# Patient Record
Sex: Female | Born: 1935
Health system: Southern US, Community
[De-identification: ages and names within clinical notes are randomized; demographics above are authoritative.]

## PROBLEM LIST (undated history)

## (undated) DIAGNOSIS — N3941 Urge incontinence: Secondary | ICD-10-CM

## (undated) DIAGNOSIS — R739 Hyperglycemia, unspecified: Secondary | ICD-10-CM

## (undated) DIAGNOSIS — M6530 Trigger finger, unspecified finger: Secondary | ICD-10-CM

## (undated) DIAGNOSIS — L570 Actinic keratosis: Secondary | ICD-10-CM

## (undated) DIAGNOSIS — R457 State of emotional shock and stress, unspecified: Secondary | ICD-10-CM

## (undated) DIAGNOSIS — I10 Essential (primary) hypertension: Secondary | ICD-10-CM

## (undated) DIAGNOSIS — I2510 Atherosclerotic heart disease of native coronary artery without angina pectoris: Secondary | ICD-10-CM

## (undated) DIAGNOSIS — E039 Hypothyroidism, unspecified: Secondary | ICD-10-CM

## (undated) HISTORY — PX: COLONOSCOPY: SHX174

## (undated) HISTORY — PX: OTHER SURGICAL HISTORY: SHX169

## (undated) HISTORY — PX: CATARACT EXTRACTION: SUR2

## (undated) HISTORY — PX: TUBAL LIGATION: SHX77

## (undated) HISTORY — PX: ABDOMINAL HYSTERECTOMY: SHX81

---

## 2003-12-27 ENCOUNTER — Ambulatory Visit: Payer: Self-pay | Admitting: Ophthalmology

## 2004-02-02 ENCOUNTER — Ambulatory Visit: Payer: Self-pay | Admitting: Ophthalmology

## 2004-02-16 ENCOUNTER — Ambulatory Visit: Payer: Self-pay | Admitting: Internal Medicine

## 2005-02-27 ENCOUNTER — Ambulatory Visit: Payer: Self-pay | Admitting: Internal Medicine

## 2005-04-10 ENCOUNTER — Ambulatory Visit: Payer: Self-pay | Admitting: Podiatry

## 2006-03-07 ENCOUNTER — Ambulatory Visit: Payer: Self-pay | Admitting: Internal Medicine

## 2007-05-09 ENCOUNTER — Ambulatory Visit: Payer: Self-pay | Admitting: Gastroenterology

## 2007-05-15 ENCOUNTER — Ambulatory Visit: Payer: Self-pay | Admitting: Internal Medicine

## 2007-06-03 ENCOUNTER — Ambulatory Visit: Payer: Self-pay | Admitting: Gastroenterology

## 2008-05-18 ENCOUNTER — Ambulatory Visit: Payer: Self-pay | Admitting: Internal Medicine

## 2009-05-24 ENCOUNTER — Ambulatory Visit: Payer: Self-pay | Admitting: Internal Medicine

## 2010-06-01 ENCOUNTER — Ambulatory Visit: Payer: Self-pay | Admitting: Internal Medicine

## 2011-08-21 ENCOUNTER — Ambulatory Visit: Payer: Self-pay | Admitting: Internal Medicine

## 2012-07-08 ENCOUNTER — Ambulatory Visit: Payer: Self-pay | Admitting: Gastroenterology

## 2012-08-21 ENCOUNTER — Ambulatory Visit: Payer: Self-pay | Admitting: Internal Medicine

## 2012-08-26 ENCOUNTER — Ambulatory Visit: Payer: Self-pay | Admitting: Internal Medicine

## 2013-08-24 ENCOUNTER — Ambulatory Visit: Payer: Self-pay | Admitting: Internal Medicine

## 2014-12-08 ENCOUNTER — Other Ambulatory Visit: Payer: Self-pay | Admitting: Internal Medicine

## 2014-12-08 DIAGNOSIS — Z1231 Encounter for screening mammogram for malignant neoplasm of breast: Secondary | ICD-10-CM

## 2014-12-16 ENCOUNTER — Ambulatory Visit
Admission: RE | Admit: 2014-12-16 | Discharge: 2014-12-16 | Disposition: A | Discharge status: Home / Self Care | Payer: PPO | Source: Ambulatory Visit | Attending: Internal Medicine | Admitting: Internal Medicine

## 2014-12-16 DIAGNOSIS — Z1231 Encounter for screening mammogram for malignant neoplasm of breast: Secondary | ICD-10-CM

## 2015-08-11 DIAGNOSIS — Z961 Presence of intraocular lens: Secondary | ICD-10-CM | POA: Diagnosis not present

## 2015-08-24 DIAGNOSIS — N898 Other specified noninflammatory disorders of vagina: Secondary | ICD-10-CM | POA: Diagnosis not present

## 2015-12-02 DIAGNOSIS — Z23 Encounter for immunization: Secondary | ICD-10-CM | POA: Diagnosis not present

## 2015-12-02 DIAGNOSIS — Z0000 Encounter for general adult medical examination without abnormal findings: Secondary | ICD-10-CM | POA: Diagnosis not present

## 2015-12-02 DIAGNOSIS — Z139 Encounter for screening, unspecified: Secondary | ICD-10-CM | POA: Diagnosis not present

## 2015-12-02 DIAGNOSIS — E039 Hypothyroidism, unspecified: Secondary | ICD-10-CM | POA: Diagnosis not present

## 2015-12-05 DIAGNOSIS — Z08 Encounter for follow-up examination after completed treatment for malignant neoplasm: Secondary | ICD-10-CM | POA: Diagnosis not present

## 2015-12-05 DIAGNOSIS — Z1283 Encounter for screening for malignant neoplasm of skin: Secondary | ICD-10-CM | POA: Diagnosis not present

## 2015-12-05 DIAGNOSIS — Z85828 Personal history of other malignant neoplasm of skin: Secondary | ICD-10-CM | POA: Diagnosis not present

## 2015-12-05 DIAGNOSIS — L570 Actinic keratosis: Secondary | ICD-10-CM | POA: Diagnosis not present

## 2015-12-12 DIAGNOSIS — R739 Hyperglycemia, unspecified: Secondary | ICD-10-CM | POA: Diagnosis not present

## 2015-12-12 DIAGNOSIS — L570 Actinic keratosis: Secondary | ICD-10-CM | POA: Diagnosis not present

## 2015-12-12 DIAGNOSIS — Z23 Encounter for immunization: Secondary | ICD-10-CM | POA: Diagnosis not present

## 2015-12-12 DIAGNOSIS — E039 Hypothyroidism, unspecified: Secondary | ICD-10-CM | POA: Diagnosis not present

## 2015-12-12 DIAGNOSIS — N3941 Urge incontinence: Secondary | ICD-10-CM | POA: Diagnosis not present

## 2015-12-12 DIAGNOSIS — Z1239 Encounter for other screening for malignant neoplasm of breast: Secondary | ICD-10-CM | POA: Diagnosis not present

## 2015-12-12 DIAGNOSIS — Z0000 Encounter for general adult medical examination without abnormal findings: Secondary | ICD-10-CM | POA: Diagnosis not present

## 2015-12-13 ENCOUNTER — Other Ambulatory Visit: Payer: Self-pay | Admitting: Internal Medicine

## 2015-12-13 DIAGNOSIS — Z1231 Encounter for screening mammogram for malignant neoplasm of breast: Secondary | ICD-10-CM

## 2015-12-28 ENCOUNTER — Ambulatory Visit
Admission: RE | Admit: 2015-12-28 | Discharge: 2015-12-28 | Disposition: A | Discharge status: Home / Self Care | Payer: PPO | Source: Ambulatory Visit | Attending: Internal Medicine | Admitting: Internal Medicine

## 2015-12-28 DIAGNOSIS — Z1231 Encounter for screening mammogram for malignant neoplasm of breast: Secondary | ICD-10-CM

## 2016-01-31 DIAGNOSIS — N898 Other specified noninflammatory disorders of vagina: Secondary | ICD-10-CM | POA: Diagnosis not present

## 2016-02-21 DIAGNOSIS — N898 Other specified noninflammatory disorders of vagina: Secondary | ICD-10-CM | POA: Diagnosis not present

## 2016-02-21 DIAGNOSIS — N763 Subacute and chronic vulvitis: Secondary | ICD-10-CM | POA: Diagnosis not present

## 2016-02-21 DIAGNOSIS — N9089 Other specified noninflammatory disorders of vulva and perineum: Secondary | ICD-10-CM | POA: Diagnosis not present

## 2016-04-27 DIAGNOSIS — Z0000 Encounter for general adult medical examination without abnormal findings: Secondary | ICD-10-CM | POA: Diagnosis not present

## 2016-06-11 DIAGNOSIS — E039 Hypothyroidism, unspecified: Secondary | ICD-10-CM | POA: Diagnosis not present

## 2016-06-11 DIAGNOSIS — R739 Hyperglycemia, unspecified: Secondary | ICD-10-CM | POA: Diagnosis not present

## 2016-06-11 DIAGNOSIS — Z0000 Encounter for general adult medical examination without abnormal findings: Secondary | ICD-10-CM | POA: Diagnosis not present

## 2016-06-25 DIAGNOSIS — N9089 Other specified noninflammatory disorders of vulva and perineum: Secondary | ICD-10-CM | POA: Diagnosis not present

## 2016-08-22 DIAGNOSIS — Z961 Presence of intraocular lens: Secondary | ICD-10-CM | POA: Diagnosis not present

## 2016-09-17 DIAGNOSIS — R946 Abnormal results of thyroid function studies: Secondary | ICD-10-CM | POA: Diagnosis not present

## 2016-11-20 ENCOUNTER — Other Ambulatory Visit: Payer: Self-pay | Admitting: Internal Medicine

## 2016-11-20 DIAGNOSIS — Z1231 Encounter for screening mammogram for malignant neoplasm of breast: Secondary | ICD-10-CM

## 2016-12-04 DIAGNOSIS — L578 Other skin changes due to chronic exposure to nonionizing radiation: Secondary | ICD-10-CM | POA: Diagnosis not present

## 2016-12-04 DIAGNOSIS — Z859 Personal history of malignant neoplasm, unspecified: Secondary | ICD-10-CM | POA: Diagnosis not present

## 2016-12-04 DIAGNOSIS — Z872 Personal history of diseases of the skin and subcutaneous tissue: Secondary | ICD-10-CM | POA: Diagnosis not present

## 2016-12-04 DIAGNOSIS — L900 Lichen sclerosus et atrophicus: Secondary | ICD-10-CM | POA: Diagnosis not present

## 2016-12-04 DIAGNOSIS — L570 Actinic keratosis: Secondary | ICD-10-CM | POA: Diagnosis not present

## 2016-12-04 DIAGNOSIS — B373 Candidiasis of vulva and vagina: Secondary | ICD-10-CM | POA: Diagnosis not present

## 2016-12-05 DIAGNOSIS — Z0000 Encounter for general adult medical examination without abnormal findings: Secondary | ICD-10-CM | POA: Diagnosis not present

## 2016-12-05 DIAGNOSIS — R739 Hyperglycemia, unspecified: Secondary | ICD-10-CM | POA: Diagnosis not present

## 2016-12-05 DIAGNOSIS — E039 Hypothyroidism, unspecified: Secondary | ICD-10-CM | POA: Diagnosis not present

## 2016-12-12 DIAGNOSIS — Z23 Encounter for immunization: Secondary | ICD-10-CM | POA: Diagnosis not present

## 2016-12-12 DIAGNOSIS — E039 Hypothyroidism, unspecified: Secondary | ICD-10-CM | POA: Diagnosis not present

## 2016-12-12 DIAGNOSIS — R110 Nausea: Secondary | ICD-10-CM | POA: Diagnosis not present

## 2016-12-12 DIAGNOSIS — R739 Hyperglycemia, unspecified: Secondary | ICD-10-CM | POA: Diagnosis not present

## 2016-12-12 DIAGNOSIS — Z0000 Encounter for general adult medical examination without abnormal findings: Secondary | ICD-10-CM | POA: Diagnosis not present

## 2016-12-18 DIAGNOSIS — Z86010 Personal history of colonic polyps: Secondary | ICD-10-CM | POA: Diagnosis not present

## 2017-01-09 ENCOUNTER — Ambulatory Visit
Admission: RE | Admit: 2017-01-09 | Discharge: 2017-01-09 | Disposition: A | Discharge status: Home / Self Care | Payer: PPO | Source: Ambulatory Visit | Attending: Internal Medicine | Admitting: Internal Medicine

## 2017-01-09 DIAGNOSIS — Z1231 Encounter for screening mammogram for malignant neoplasm of breast: Secondary | ICD-10-CM

## 2017-01-15 DIAGNOSIS — L900 Lichen sclerosus et atrophicus: Secondary | ICD-10-CM | POA: Diagnosis not present

## 2017-02-14 ENCOUNTER — Encounter: Payer: Self-pay | Admitting: *Deleted

## 2017-02-15 ENCOUNTER — Ambulatory Visit
Admission: RE | Admit: 2017-02-15 | Discharge: 2017-02-15 | Disposition: A | Discharge status: Home / Self Care | Payer: PPO | Source: Ambulatory Visit | Attending: Gastroenterology | Admitting: Gastroenterology

## 2017-02-15 ENCOUNTER — Encounter: Payer: Self-pay | Admitting: Anesthesiology

## 2017-02-15 ENCOUNTER — Encounter
Admission: RE | Disposition: A | Discharge status: Home / Self Care | Payer: Self-pay | Source: Ambulatory Visit | Attending: Gastroenterology

## 2017-02-15 ENCOUNTER — Ambulatory Visit: Payer: PPO | Admitting: Anesthesiology

## 2017-02-15 DIAGNOSIS — K5730 Diverticulosis of large intestine without perforation or abscess without bleeding: Secondary | ICD-10-CM | POA: Diagnosis not present

## 2017-02-15 DIAGNOSIS — K649 Unspecified hemorrhoids: Secondary | ICD-10-CM | POA: Diagnosis not present

## 2017-02-15 DIAGNOSIS — K5900 Constipation, unspecified: Secondary | ICD-10-CM | POA: Diagnosis not present

## 2017-02-15 DIAGNOSIS — K5790 Diverticulosis of intestine, part unspecified, without perforation or abscess without bleeding: Secondary | ICD-10-CM | POA: Diagnosis not present

## 2017-02-15 DIAGNOSIS — K648 Other hemorrhoids: Secondary | ICD-10-CM | POA: Diagnosis not present

## 2017-02-15 DIAGNOSIS — Z86010 Personal history of colonic polyps: Secondary | ICD-10-CM | POA: Diagnosis not present

## 2017-02-15 DIAGNOSIS — K621 Rectal polyp: Secondary | ICD-10-CM | POA: Diagnosis not present

## 2017-02-15 DIAGNOSIS — Z79899 Other long term (current) drug therapy: Secondary | ICD-10-CM | POA: Insufficient documentation

## 2017-02-15 DIAGNOSIS — E039 Hypothyroidism, unspecified: Secondary | ICD-10-CM | POA: Diagnosis not present

## 2017-02-15 HISTORY — DX: Hypothyroidism, unspecified: E03.9

## 2017-02-15 HISTORY — DX: Actinic keratosis: L57.0

## 2017-02-15 HISTORY — DX: Trigger finger, unspecified finger: M65.30

## 2017-02-15 HISTORY — PX: COLONOSCOPY WITH PROPOFOL: SHX5780

## 2017-02-15 HISTORY — DX: Urge incontinence: N39.41

## 2017-02-15 HISTORY — DX: Hyperglycemia, unspecified: R73.9

## 2017-02-15 HISTORY — DX: State of emotional shock and stress, unspecified: R45.7

## 2017-02-15 SURGERY — COLONOSCOPY WITH PROPOFOL
Anesthesia: General

## 2017-02-15 MED ORDER — FENTANYL CITRATE (PF) 100 MCG/2ML IJ SOLN
INTRAMUSCULAR | Status: DC | PRN
  Administered 2017-02-15: 50 ug via INTRAVENOUS

## 2017-02-15 MED ORDER — PROPOFOL 500 MG/50ML IV EMUL
INTRAVENOUS | Status: AC
  Filled 2017-02-15: qty 50

## 2017-02-15 MED ORDER — PROPOFOL 500 MG/50ML IV EMUL
INTRAVENOUS | Status: DC | PRN
  Administered 2017-02-15: 100 ug/kg/min via INTRAVENOUS

## 2017-02-15 MED ORDER — SODIUM CHLORIDE 0.9 % IV SOLN: INTRAVENOUS | Status: DC

## 2017-02-15 MED ORDER — FENTANYL CITRATE (PF) 100 MCG/2ML IJ SOLN
INTRAMUSCULAR | Status: AC
  Filled 2017-02-15: qty 2

## 2017-02-15 MED ORDER — SODIUM CHLORIDE 0.9 % IV SOLN
INTRAVENOUS | Status: DC
  Administered 2017-02-15: 1000 mL via INTRAVENOUS

## 2017-02-15 MED ORDER — EPHEDRINE SULFATE 50 MG/ML IJ SOLN
INTRAMUSCULAR | Status: DC | PRN
  Administered 2017-02-15: 10 mg via INTRAVENOUS

## 2017-02-15 NOTE — Anesthesia Post-op Follow-up Note (Signed)
Anesthesia QCDR form completed.        

## 2017-02-15 NOTE — Anesthesia Preprocedure Evaluation (Signed)
Anesthesia Evaluation  Patient identified by MRN, date of birth, ID band Patient awake    Reviewed: Allergy & Precautions, NPO status , Patient's Chart, lab work & pertinent test results  History of Anesthesia Complications Negative for: history of anesthetic complications  Airway Mallampati: III  TM Distance: >3 FB Neck ROM: Full    Dental  (+) Implants   Pulmonary neg pulmonary ROS, neg sleep apnea, neg COPD,    breath sounds clear to auscultation- rhonchi (-) wheezing      Cardiovascular Exercise Tolerance: Good (-) hypertension(-) CAD, (-) Past MI and (-) Cardiac Stents  Rhythm:Regular Rate:Normal - Systolic murmurs and - Diastolic murmurs    Neuro/Psych negative neurological ROS  negative psych ROS   GI/Hepatic negative GI ROS, Neg liver ROS,   Endo/Other  neg diabetesHypothyroidism   Renal/GU negative Renal ROS     Musculoskeletal negative musculoskeletal ROS (+)   Abdominal (+) - obese,   Peds  Hematology negative hematology ROS (+)   Anesthesia Other Findings Past Medical History: No date: Elevated blood sugar No date: Emotional stress No date: Hypothyroidism No date: Keratosis No date: Stenosing tenosynovitis of finger No date: Urge incontinence of urine   Reproductive/Obstetrics                             Anesthesia Physical Anesthesia Plan  ASA: II  Anesthesia Plan: General   Post-op Pain Management:    Induction: Intravenous  PONV Risk Score and Plan: 2 and Propofol infusion  Airway Management Planned: Natural Airway  Additional Equipment:   Intra-op Plan:   Post-operative Plan:   Informed Consent: I have reviewed the patients History and Physical, chart, labs and discussed the procedure including the risks, benefits and alternatives for the proposed anesthesia with the patient or authorized representative who has indicated his/her understanding and  acceptance.   Dental advisory given  Plan Discussed with: CRNA and Anesthesiologist  Anesthesia Plan Comments:         Anesthesia Quick Evaluation

## 2017-02-15 NOTE — Anesthesia Postprocedure Evaluation (Signed)
Anesthesia Post Note  Patient: Tracey Robles  Procedure(s) Performed: COLONOSCOPY WITH PROPOFOL (N/A )  Patient location during evaluation: Endoscopy Anesthesia Type: General Level of consciousness: awake and alert and oriented Pain management: pain level controlled Vital Signs Assessment: post-procedure vital signs reviewed and stable Respiratory status: spontaneous breathing, nonlabored ventilation and respiratory function stable Cardiovascular status: blood pressure returned to baseline and stable Postop Assessment: no signs of nausea or vomiting Anesthetic complications: no     Last Vitals:  Vitals:   02/15/17 1146 02/15/17 1310  BP: (!) 144/71   Pulse: 81   Resp: 17   Temp: (!) 36.3 C (!) 35.7 C  SpO2: 99%     Last Pain:  Vitals:   02/15/17 1310  TempSrc: Tympanic                 Airyana Sprunger

## 2017-02-15 NOTE — Op Note (Signed)
[facility]astroenter[facility] Patient Name: Zanetta Dehaan Procedure Date: 02/15/2017 12:31 PM MRN: [id number] Account #: [id number] Date of Birth: [birth year] Admit Type: Outpatient Age: 83 Room: [facility] ENDO ROOM 1 Gender: Female Note Status: Finalized Procedure:            Colonoscopy Indications:          Change in bowel habits, Constipation Providers:            Lollie Sails, MD Referring MD:         Tracie Harrier, MD (Referring MD) Medicines:            Monitored Anesthesia Care Complications:        No immediate complications. Procedure:            Pre-Anesthesia Assessment:                       - ASA Grade Assessment: II - A patient with mild                        systemic disease.                       After obtaining informed consent, the colonoscope was                        passed under direct vision. Throughout the procedure,                        the patient's blood pressure, pulse, and oxygen                        saturations were monitored continuously. The Olympus                        PCF-H180AL colonoscope ( S#: [id number] ) was introduced                        through the anus and advanced to the the cecum,                        identified by appendiceal orifice and ileocecal valve.                        The colonoscopy was performed with moderate difficulty                        due to multiple diverticula in the colon and a tortuous                        colon. Successful completion of the procedure was aided                        by changing the patient to a supine position, changing                        the patient to a prone position and using manual                        pressure. The patient tolerated the procedure well. The  quality of the bowel preparation was fair. Findings:      Many small and large-mouthed diverticula were found in the sigmoid       colon, descending colon and transverse  colon.      Four sessile polyps were found in the rectum. The polyps were less than       1 mm in size. These polyps were removed with a cold biopsy forceps.       Resection and retrieval were complete.      Non-bleeding internal hemorrhoids were found during anoscopy. The       hemorrhoids were small.      The digital rectal exam was normal. Impression:           - Preparation of the colon was fair.                       - Diverticulosis in the sigmoid colon, in the                        descending colon and in the transverse colon.                       - Four less than 1 mm polyps in the rectum, removed                        with a cold biopsy forceps. Resected and retrieved.                       - Non-bleeding internal hemorrhoids. Recommendation:       - Discharge patient to home.                       - Advance diet as tolerated. Procedure Code(s):    --- Professional ---                       [phone], Colonoscopy, flexible; with biopsy, single or                        multiple Diagnosis Code(s):    --- Professional ---                       K64.8, Other hemorrhoids                       K62.1, Rectal polyp                       R19.4, Change in bowel habit                       K59.00, Constipation, unspecified                       K57.30, Diverticulosis of large intestine without                        perforation or abscess without bleeding CPT copyright 2016 American Medical Association. All rights reserved. The codes documented in this report are preliminary and upon coder review may  be revised to meet current compliance requirements. Lollie Sails, MD 02/15/2017 1:08:29 PM This report has been signed electronically. Number of Addenda: 0 Note Initiated  On: 02/15/2017 12:31 PM Scope Withdrawal Time: 0 hours 11 minutes 11 seconds  Total Procedure Duration: 0 hours 29 minutes 37 seconds       [facility]

## 2017-02-15 NOTE — H&P (Signed)
Outpatient short stay form Pre-procedure 02/15/2017 12:09 PM Lollie Sails MD  Primary Physician: Dr Tracie Harrier  Reason for visit:  Colonoscopy  History of present illness:  Patient is a 83 year old female presenting today as above. She has a personal history of adenomatous colon polyps as well as some increasing constipation of past couple months. She tolerated her prep well. She takes no aspirin or blood thinning agent.    Current Facility-Administered Medications:  .  0.9 %  sodium chloride infusion, , Intravenous, Continuous, Lollie Sails, MD, Last Rate: 20 mL/hr at 02/15/17 1201, 1,000 mL at 02/15/17 1201 .  0.9 %  sodium chloride infusion, , Intravenous, Continuous, Lollie Sails, MD  Medications Prior to Admission  Medication Sig Dispense Refill Last Dose  . clobetasol cream (TEMOVATE) 3.84 % Apply 1 application topically 2 (two) times daily.     . fluconazole (DIFLUCAN) 200 MG tablet Take 200 mg by mouth daily.     Marland Kitchen levothyroxine (SYNTHROID, LEVOTHROID) 75 MCG tablet Take 75 mcg by mouth daily before breakfast.   02/15/2017 at 0400     Allergies  Allergen Reactions  . Iodine      Past Medical History:  Diagnosis Date  . Elevated blood sugar   . Emotional stress   . Hypothyroidism   . Keratosis   . Stenosing tenosynovitis of finger   . Urge incontinence of urine     Review of systems:      Physical Exam    Heart and lungs: Regular rate and rhythm without rub or gallop, lungs are bilaterally clear.    HEENT: Normocephalic atraumatic eyes are anicteric    Other:     Pertinant exam for procedure: Soft nontender nondistended bowel sounds positive normoactive    Planned proceedures: Colonoscopy and indicated procedures. I have discussed the risks benefits and complications of procedures to include not limited to bleeding, infection, perforation and the risk of sedation and the patient wishes to proceed.    Lollie Sails,  MD Gastroenterology 02/15/2017  12:09 PM

## 2017-02-15 NOTE — Anesthesia Procedure Notes (Signed)
Performed by: Cook-Martin, Azaliyah Kennard Pre-anesthesia Checklist: Patient identified, Emergency Drugs available, Suction available, Patient being monitored and Timeout performed Patient Re-evaluated:Patient Re-evaluated prior to induction Oxygen Delivery Method: Nasal cannula Preoxygenation: Pre-oxygenation with 100% oxygen Induction Type: IV induction Placement Confirmation: CO2 detector and positive ETCO2       

## 2017-02-15 NOTE — Transfer of Care (Signed)
Immediate Anesthesia Transfer of Care Note  Patient: Tracey Robles  Procedure(s) Performed: COLONOSCOPY WITH PROPOFOL (N/A )  Patient Location: PACU  Anesthesia Type:General  Level of Consciousness: awake and sedated  Airway & Oxygen Therapy: Patient Spontanous Breathing and Patient connected to nasal cannula oxygen  Post-op Assessment: Report given to RN and Post -op Vital signs reviewed and stable  Post vital signs: Reviewed and stable  Last Vitals:  Vitals:   02/15/17 1146  BP: (!) 144/71  Pulse: 81  Resp: 17  Temp: (!) 36.3 C  SpO2: 99%    Last Pain:  Vitals:   02/15/17 1146  TempSrc: Tympanic         Complications: No apparent anesthesia complications

## 2017-02-18 ENCOUNTER — Encounter: Payer: Self-pay | Admitting: Gastroenterology

## 2017-02-27 LAB — SURGICAL PATHOLOGY

## 2017-06-05 DIAGNOSIS — R110 Nausea: Secondary | ICD-10-CM | POA: Diagnosis not present

## 2017-06-05 DIAGNOSIS — Z0000 Encounter for general adult medical examination without abnormal findings: Secondary | ICD-10-CM | POA: Diagnosis not present

## 2017-06-05 DIAGNOSIS — R739 Hyperglycemia, unspecified: Secondary | ICD-10-CM | POA: Diagnosis not present

## 2017-06-05 DIAGNOSIS — E039 Hypothyroidism, unspecified: Secondary | ICD-10-CM | POA: Diagnosis not present

## 2017-06-12 DIAGNOSIS — E039 Hypothyroidism, unspecified: Secondary | ICD-10-CM | POA: Diagnosis not present

## 2017-06-12 DIAGNOSIS — R739 Hyperglycemia, unspecified: Secondary | ICD-10-CM | POA: Diagnosis not present

## 2017-06-12 DIAGNOSIS — R198 Other specified symptoms and signs involving the digestive system and abdomen: Secondary | ICD-10-CM | POA: Diagnosis not present

## 2017-06-12 DIAGNOSIS — R112 Nausea with vomiting, unspecified: Secondary | ICD-10-CM | POA: Diagnosis not present

## 2017-06-12 DIAGNOSIS — K582 Mixed irritable bowel syndrome: Secondary | ICD-10-CM | POA: Diagnosis not present

## 2017-07-17 DIAGNOSIS — D7589 Other specified diseases of blood and blood-forming organs: Secondary | ICD-10-CM | POA: Diagnosis not present

## 2017-07-17 DIAGNOSIS — L900 Lichen sclerosus et atrophicus: Secondary | ICD-10-CM | POA: Diagnosis not present

## 2017-12-06 ENCOUNTER — Other Ambulatory Visit: Payer: Self-pay | Admitting: Internal Medicine

## 2017-12-06 DIAGNOSIS — Z1231 Encounter for screening mammogram for malignant neoplasm of breast: Secondary | ICD-10-CM

## 2017-12-11 DIAGNOSIS — L578 Other skin changes due to chronic exposure to nonionizing radiation: Secondary | ICD-10-CM | POA: Diagnosis not present

## 2017-12-11 DIAGNOSIS — L570 Actinic keratosis: Secondary | ICD-10-CM | POA: Diagnosis not present

## 2017-12-11 DIAGNOSIS — Z859 Personal history of malignant neoplasm, unspecified: Secondary | ICD-10-CM | POA: Diagnosis not present

## 2017-12-11 DIAGNOSIS — B373 Candidiasis of vulva and vagina: Secondary | ICD-10-CM | POA: Diagnosis not present

## 2017-12-11 DIAGNOSIS — Z1283 Encounter for screening for malignant neoplasm of skin: Secondary | ICD-10-CM | POA: Diagnosis not present

## 2017-12-11 DIAGNOSIS — Z872 Personal history of diseases of the skin and subcutaneous tissue: Secondary | ICD-10-CM | POA: Diagnosis not present

## 2017-12-11 DIAGNOSIS — L900 Lichen sclerosus et atrophicus: Secondary | ICD-10-CM | POA: Diagnosis not present

## 2017-12-30 DIAGNOSIS — R8290 Unspecified abnormal findings in urine: Secondary | ICD-10-CM | POA: Diagnosis not present

## 2017-12-30 DIAGNOSIS — R739 Hyperglycemia, unspecified: Secondary | ICD-10-CM | POA: Diagnosis not present

## 2017-12-30 DIAGNOSIS — R198 Other specified symptoms and signs involving the digestive system and abdomen: Secondary | ICD-10-CM | POA: Diagnosis not present

## 2017-12-30 DIAGNOSIS — E039 Hypothyroidism, unspecified: Secondary | ICD-10-CM | POA: Diagnosis not present

## 2017-12-30 DIAGNOSIS — R112 Nausea with vomiting, unspecified: Secondary | ICD-10-CM | POA: Diagnosis not present

## 2018-01-06 DIAGNOSIS — R739 Hyperglycemia, unspecified: Secondary | ICD-10-CM | POA: Diagnosis not present

## 2018-01-06 DIAGNOSIS — E786 Lipoprotein deficiency: Secondary | ICD-10-CM | POA: Diagnosis not present

## 2018-01-06 DIAGNOSIS — E039 Hypothyroidism, unspecified: Secondary | ICD-10-CM | POA: Diagnosis not present

## 2018-01-06 DIAGNOSIS — Z0000 Encounter for general adult medical examination without abnormal findings: Secondary | ICD-10-CM | POA: Diagnosis not present

## 2018-01-06 DIAGNOSIS — L570 Actinic keratosis: Secondary | ICD-10-CM | POA: Diagnosis not present

## 2018-01-06 DIAGNOSIS — Z23 Encounter for immunization: Secondary | ICD-10-CM | POA: Diagnosis not present

## 2018-01-10 ENCOUNTER — Ambulatory Visit
Admission: RE | Admit: 2018-01-10 | Discharge: 2018-01-10 | Disposition: A | Discharge status: Home / Self Care | Payer: PPO | Source: Ambulatory Visit | Attending: Internal Medicine | Admitting: Internal Medicine

## 2018-01-10 DIAGNOSIS — Z1231 Encounter for screening mammogram for malignant neoplasm of breast: Secondary | ICD-10-CM | POA: Diagnosis not present

## 2018-04-07 ENCOUNTER — Emergency Department: Payer: PPO

## 2018-04-07 ENCOUNTER — Encounter: Payer: Self-pay | Admitting: Emergency Medicine

## 2018-04-07 ENCOUNTER — Inpatient Hospital Stay
Admission: EM | Admit: 2018-04-07 | Discharge: 2018-04-09 | DRG: 247 | Disposition: A | Discharge status: Home / Self Care | Payer: PPO | Attending: Family Medicine | Admitting: Family Medicine

## 2018-04-07 ENCOUNTER — Other Ambulatory Visit: Payer: Self-pay

## 2018-04-07 ENCOUNTER — Encounter
Admission: EM | Disposition: A | Discharge status: Home / Self Care | Payer: Self-pay | Source: Home / Self Care | Attending: Family Medicine

## 2018-04-07 DIAGNOSIS — Z66 Do not resuscitate: Secondary | ICD-10-CM | POA: Diagnosis present

## 2018-04-07 DIAGNOSIS — I2510 Atherosclerotic heart disease of native coronary artery without angina pectoris: Secondary | ICD-10-CM | POA: Diagnosis present

## 2018-04-07 DIAGNOSIS — Z79899 Other long term (current) drug therapy: Secondary | ICD-10-CM

## 2018-04-07 DIAGNOSIS — R739 Hyperglycemia, unspecified: Secondary | ICD-10-CM | POA: Diagnosis not present

## 2018-04-07 DIAGNOSIS — R0789 Other chest pain: Secondary | ICD-10-CM | POA: Diagnosis not present

## 2018-04-07 DIAGNOSIS — R7989 Other specified abnormal findings of blood chemistry: Secondary | ICD-10-CM | POA: Diagnosis not present

## 2018-04-07 DIAGNOSIS — E039 Hypothyroidism, unspecified: Secondary | ICD-10-CM | POA: Diagnosis not present

## 2018-04-07 DIAGNOSIS — Z79890 Hormone replacement therapy (postmenopausal): Secondary | ICD-10-CM | POA: Diagnosis not present

## 2018-04-07 DIAGNOSIS — R079 Chest pain, unspecified: Secondary | ICD-10-CM | POA: Diagnosis not present

## 2018-04-07 DIAGNOSIS — I10 Essential (primary) hypertension: Secondary | ICD-10-CM | POA: Diagnosis present

## 2018-04-07 DIAGNOSIS — Z888 Allergy status to other drugs, medicaments and biological substances status: Secondary | ICD-10-CM

## 2018-04-07 DIAGNOSIS — E7439 Other disorders of intestinal carbohydrate absorption: Secondary | ICD-10-CM | POA: Diagnosis not present

## 2018-04-07 DIAGNOSIS — I2584 Coronary atherosclerosis due to calcified coronary lesion: Secondary | ICD-10-CM | POA: Diagnosis present

## 2018-04-07 DIAGNOSIS — R778 Other specified abnormalities of plasma proteins: Secondary | ICD-10-CM

## 2018-04-07 DIAGNOSIS — Z90710 Acquired absence of both cervix and uterus: Secondary | ICD-10-CM | POA: Diagnosis not present

## 2018-04-07 DIAGNOSIS — E782 Mixed hyperlipidemia: Secondary | ICD-10-CM | POA: Diagnosis not present

## 2018-04-07 DIAGNOSIS — I214 Non-ST elevation (NSTEMI) myocardial infarction: Principal | ICD-10-CM | POA: Diagnosis present

## 2018-04-07 DIAGNOSIS — Z955 Presence of coronary angioplasty implant and graft: Secondary | ICD-10-CM | POA: Diagnosis not present

## 2018-04-07 HISTORY — PX: CORONARY STENT INTERVENTION: CATH118234

## 2018-04-07 HISTORY — PX: LEFT HEART CATH AND CORONARY ANGIOGRAPHY: CATH118249

## 2018-04-07 LAB — MRSA PCR SCREENING: MRSA by PCR: NEGATIVE

## 2018-04-07 LAB — CBC
HCT: 43.7 % (ref 36.0–46.0)
Hemoglobin: 14.4 g/dL (ref 12.0–15.0)
MCH: 31.2 pg (ref 26.0–34.0)
MCHC: 33 g/dL (ref 30.0–36.0)
MCV: 94.8 fL (ref 80.0–100.0)
NRBC: 0 % (ref 0.0–0.2)
Platelets: 246 10*3/uL (ref 150–400)
RBC: 4.61 MIL/uL (ref 3.87–5.11)
RDW: 12.8 % (ref 11.5–15.5)
WBC: 7.7 10*3/uL (ref 4.0–10.5)

## 2018-04-07 LAB — HEMOGLOBIN A1C
Hgb A1c MFr Bld: 7 % — ABNORMAL HIGH (ref 4.8–5.6)
Mean Plasma Glucose: 154.2 mg/dL

## 2018-04-07 LAB — BASIC METABOLIC PANEL
Anion gap: 9 (ref 5–15)
BUN: 19 mg/dL (ref 8–23)
CO2: 25 mmol/L (ref 22–32)
CREATININE: 0.92 mg/dL (ref 0.44–1.00)
Calcium: 9.2 mg/dL (ref 8.9–10.3)
Chloride: 105 mmol/L (ref 98–111)
GFR calc Af Amer: >60 mL/min (ref >60)
GFR calc non Af Amer: 57 mL/min — ABNORMAL LOW (ref >60)
Glucose, Bld: 231 mg/dL — ABNORMAL HIGH (ref 70–99)
Potassium: 3.9 mmol/L (ref 3.5–5.1)
Sodium: 139 mmol/L (ref 135–145)

## 2018-04-07 LAB — POCT ACTIVATED CLOTTING TIME: Activated Clotting Time: 329 seconds

## 2018-04-07 LAB — APTT: aPTT: 28 seconds (ref 24–36)

## 2018-04-07 LAB — PROTIME-INR
INR: 1.06
Prothrombin Time: 13.7 seconds (ref 11.4–15.2)

## 2018-04-07 LAB — TROPONIN I
Troponin I: 0.19 ng/mL (ref <0.03)
Troponin I: 0.33 ng/mL (ref <0.03)

## 2018-04-07 SURGERY — LEFT HEART CATH AND CORONARY ANGIOGRAPHY
Anesthesia: Moderate Sedation

## 2018-04-07 MED ORDER — BIVALIRUDIN BOLUS VIA INFUSION - CUPID
INTRAVENOUS | Status: DC | PRN
  Administered 2018-04-07: 52.725 mg via INTRAVENOUS

## 2018-04-07 MED ORDER — ASPIRIN 81 MG PO CHEW: 81.0000 mg | CHEWABLE_TABLET | Freq: Every day | ORAL | Status: DC

## 2018-04-07 MED ORDER — TICAGRELOR 90 MG PO TABS
ORAL_TABLET | ORAL | Status: AC
  Filled 2018-04-07: qty 2

## 2018-04-07 MED ORDER — MIDAZOLAM HCL 2 MG/2ML IJ SOLN
INTRAMUSCULAR | Status: AC
  Filled 2018-04-07: qty 2

## 2018-04-07 MED ORDER — ACETAMINOPHEN 325 MG PO TABS: 650.0000 mg | ORAL_TABLET | ORAL | Status: DC | PRN

## 2018-04-07 MED ORDER — SODIUM CHLORIDE 0.9 % IV SOLN: 250.0000 mL | INTRAVENOUS | Status: DC | PRN

## 2018-04-07 MED ORDER — ASPIRIN 81 MG PO CHEW: 324.0000 mg | CHEWABLE_TABLET | ORAL | Status: DC

## 2018-04-07 MED ORDER — HEPARIN BOLUS VIA INFUSION
4000.0000 [IU] | Freq: Once | INTRAVENOUS | Status: DC
  Filled 2018-04-07: qty 4000

## 2018-04-07 MED ORDER — NITROGLYCERIN 1 MG/10 ML FOR IR/CATH LAB
INTRA_ARTERIAL | Status: DC | PRN
  Administered 2018-04-07: 200 ug via INTRACORONARY

## 2018-04-07 MED ORDER — ASPIRIN EC 81 MG PO TBEC
81.0000 mg | DELAYED_RELEASE_TABLET | Freq: Every day | ORAL | Status: DC
  Administered 2018-04-08 – 2018-04-09 (×2): 81 mg via ORAL
  Filled 2018-04-07 (×2): qty 1

## 2018-04-07 MED ORDER — ALPRAZOLAM 0.25 MG PO TABS: 0.2500 mg | ORAL_TABLET | Freq: Two times a day (BID) | ORAL | Status: DC | PRN

## 2018-04-07 MED ORDER — NITROGLYCERIN 0.4 MG SL SUBL
0.4000 mg | SUBLINGUAL_TABLET | SUBLINGUAL | Status: DC | PRN
  Administered 2018-04-08: 0.4 mg via SUBLINGUAL
  Filled 2018-04-07: qty 1

## 2018-04-07 MED ORDER — ALPRAZOLAM 0.25 MG PO TABS
0.2500 mg | ORAL_TABLET | Freq: Once | ORAL | Status: AC
  Administered 2018-04-07: 0.25 mg via ORAL
  Filled 2018-04-07: qty 1

## 2018-04-07 MED ORDER — HYDRALAZINE HCL 20 MG/ML IJ SOLN: 5.0000 mg | INTRAMUSCULAR | Status: DC | PRN

## 2018-04-07 MED ORDER — FENTANYL CITRATE (PF) 100 MCG/2ML IJ SOLN
INTRAMUSCULAR | Status: DC | PRN
  Administered 2018-04-07: 25 ug via INTRAVENOUS

## 2018-04-07 MED ORDER — SODIUM CHLORIDE 0.9% FLUSH: 3.0000 mL | INTRAVENOUS | Status: DC | PRN

## 2018-04-07 MED ORDER — BIVALIRUDIN TRIFLUOROACETATE 250 MG IV SOLR
INTRAVENOUS | Status: AC
  Filled 2018-04-07: qty 250

## 2018-04-07 MED ORDER — LABETALOL HCL 5 MG/ML IV SOLN: 10.0000 mg | INTRAVENOUS | Status: DC | PRN

## 2018-04-07 MED ORDER — TICAGRELOR 90 MG PO TABS
ORAL_TABLET | ORAL | Status: DC | PRN
  Administered 2018-04-07: 180 mg via ORAL

## 2018-04-07 MED ORDER — CALCIUM CARBONATE ANTACID 500 MG PO CHEW
1.0000 | CHEWABLE_TABLET | Freq: Three times a day (TID) | ORAL | Status: DC | PRN
  Administered 2018-04-07 – 2018-04-08 (×2): 200 mg via ORAL
  Filled 2018-04-07 (×2): qty 1

## 2018-04-07 MED ORDER — SODIUM CHLORIDE 0.9 % WEIGHT BASED INFUSION
1.0000 mL/kg/h | INTRAVENOUS | Status: DC
  Administered 2018-04-08: 1 mL/kg/h via INTRAVENOUS

## 2018-04-07 MED ORDER — TICAGRELOR 90 MG PO TABS
90.0000 mg | ORAL_TABLET | Freq: Two times a day (BID) | ORAL | Status: DC
  Administered 2018-04-08: 90 mg via ORAL
  Filled 2018-04-07 (×3): qty 1

## 2018-04-07 MED ORDER — ATORVASTATIN CALCIUM 20 MG PO TABS
40.0000 mg | ORAL_TABLET | Freq: Every day | ORAL | Status: DC
  Administered 2018-04-08: 40 mg via ORAL
  Filled 2018-04-07: qty 2

## 2018-04-07 MED ORDER — SODIUM CHLORIDE 0.9% FLUSH: 3.0000 mL | Freq: Two times a day (BID) | INTRAVENOUS | Status: DC

## 2018-04-07 MED ORDER — HEPARIN (PORCINE) 25000 UT/250ML-% IV SOLN: 800.0000 [IU]/h | INTRAVENOUS | Status: DC

## 2018-04-07 MED ORDER — SODIUM CHLORIDE 0.9 % IV SOLN
INTRAVENOUS | Status: AC | PRN
  Administered 2018-04-07: 1.75 mg/kg/h via INTRAVENOUS

## 2018-04-07 MED ORDER — LEVOTHYROXINE SODIUM 50 MCG PO TABS
75.0000 ug | ORAL_TABLET | Freq: Every day | ORAL | Status: DC
  Administered 2018-04-08 – 2018-04-09 (×2): 75 ug via ORAL
  Filled 2018-04-07: qty 1
  Filled 2018-04-07: qty 2

## 2018-04-07 MED ORDER — NITROGLYCERIN 5 MG/ML IV SOLN
INTRAVENOUS | Status: AC
  Filled 2018-04-07: qty 10

## 2018-04-07 MED ORDER — ONDANSETRON HCL 4 MG/2ML IJ SOLN: 4.0000 mg | Freq: Four times a day (QID) | INTRAMUSCULAR | Status: DC | PRN

## 2018-04-07 MED ORDER — SODIUM CHLORIDE 0.9% FLUSH
3.0000 mL | Freq: Two times a day (BID) | INTRAVENOUS | Status: DC
  Administered 2018-04-08 – 2018-04-09 (×3): 3 mL via INTRAVENOUS

## 2018-04-07 MED ORDER — MIDAZOLAM HCL 2 MG/2ML IJ SOLN
INTRAMUSCULAR | Status: DC | PRN
  Administered 2018-04-07: 0.5 mg via INTRAVENOUS

## 2018-04-07 MED ORDER — HEPARIN (PORCINE) IN NACL 1000-0.9 UT/500ML-% IV SOLN
INTRAVENOUS | Status: DC | PRN
  Administered 2018-04-07 (×2): 500 mL

## 2018-04-07 MED ORDER — SODIUM CHLORIDE 0.9 % WEIGHT BASED INFUSION: 1.0000 mL/kg/h | INTRAVENOUS | Status: DC

## 2018-04-07 MED ORDER — SODIUM CHLORIDE 0.9 % WEIGHT BASED INFUSION
3.0000 mL/kg/h | INTRAVENOUS | Status: DC
  Administered 2018-04-07: 3 mL/kg/h via INTRAVENOUS

## 2018-04-07 MED ORDER — ASPIRIN 81 MG PO CHEW: 81.0000 mg | CHEWABLE_TABLET | ORAL | Status: DC

## 2018-04-07 MED ORDER — SODIUM CHLORIDE 0.9 % IV SOLN
0.2500 mg/kg/h | INTRAVENOUS | Status: AC
  Filled 2018-04-07: qty 250

## 2018-04-07 MED ORDER — ASPIRIN 81 MG PO CHEW
324.0000 mg | CHEWABLE_TABLET | Freq: Once | ORAL | Status: AC
  Administered 2018-04-07: 324 mg via ORAL
  Filled 2018-04-07: qty 4

## 2018-04-07 MED ORDER — HEPARIN (PORCINE) IN NACL 1000-0.9 UT/500ML-% IV SOLN
INTRAVENOUS | Status: AC
  Filled 2018-04-07: qty 1000

## 2018-04-07 MED ORDER — FENTANYL CITRATE (PF) 100 MCG/2ML IJ SOLN
INTRAMUSCULAR | Status: AC
  Filled 2018-04-07: qty 2

## 2018-04-07 MED ORDER — IOPAMIDOL (ISOVUE-300) INJECTION 61%
INTRAVENOUS | Status: DC | PRN
  Administered 2018-04-07: 135 mL via INTRA_ARTERIAL
  Administered 2018-04-07: 120 mL via INTRA_ARTERIAL

## 2018-04-07 MED ORDER — ASPIRIN 300 MG RE SUPP: 300.0000 mg | RECTAL | Status: DC

## 2018-04-07 SURGICAL SUPPLY — 20 items
BALLN TREK RX 2.5X15 (BALLOONS) ×2
BALLN ~~LOC~~ TREK RX 2.5X15 (BALLOONS) ×2
BALLOON TREK RX 2.5X15 (BALLOONS) IMPLANT
BALLOON ~~LOC~~ TREK RX 2.5X15 (BALLOONS) IMPLANT
CATH INFINITI 5FR ANG PIGTAIL (CATHETERS) ×1 IMPLANT
CATH INFINITI 5FR JL4 (CATHETERS) ×1 IMPLANT
CATH INFINITI JR4 5F (CATHETERS) ×1 IMPLANT
CATH VISTA GUIDE 6FRXBLAD3.5SH (CATHETERS) ×1 IMPLANT
DEVICE CLOSURE MYNXGRIP 6/7F (Vascular Products) ×1 IMPLANT
DEVICE INFLAT 30 PLUS (MISCELLANEOUS) ×1 IMPLANT
KIT MANI 3VAL PERCEP (MISCELLANEOUS) ×2 IMPLANT
NDL PERC 18GX7CM (NEEDLE) IMPLANT
NEEDLE PERC 18GX7CM (NEEDLE) ×2 IMPLANT
PACK CARDIAC CATH (CUSTOM PROCEDURE TRAY) ×2 IMPLANT
SHEATH AVANTI 5FR X 11CM (SHEATH) ×1 IMPLANT
SHEATH AVANTI 6FR X 11CM (SHEATH) ×1 IMPLANT
STENT RESOLUTE ONYX 2.0X12 (Permanent Stent) ×1 IMPLANT
STENT RESOLUTE ONYX 2.25X18 (Permanent Stent) ×1 IMPLANT
WIRE G HI TQ BMW 190 (WIRE) ×1 IMPLANT
WIRE GUIDERIGHT .035X150 (WIRE) ×1 IMPLANT

## 2018-04-07 NOTE — H&P (Signed)
Tracey Robles NAME: Tracey Robles    MR#:  [id number]  DATE OF BIRTH:  08-Dec-1931  DATE OF ADMISSION:  04/07/2018  PRIMARY CARE PHYSICIAN: Tracie Harrier, MD   REQUESTING/REFERRING PHYSICIAN:   CHIEF COMPLAINT:   Chief Complaint  Patient presents with  . Chest Pain    HISTORY OF PRESENT ILLNESS: Tracey Robles  is a 85 y.o. female with a known history of glucose intolerance, hypothyroidism who is presenting to the emergency room with chest pain ongoing for the past 2 weeks.  Patient states that she went to a cruise recently and since then she has been having this left-sided chest pain with activity as well as rest.  This morning the pain was the worst it woke her up.  Now the pain is resolved.  She also had pain radiating to her left arm.  EKG is negative however troponin is elevated.  Patient denies any previous cardiac history.     PAST MEDICAL HISTORY:   Past Medical History:  Diagnosis Date  . Elevated blood sugar   . Emotional stress   . Hypothyroidism   . Keratosis   . Stenosing tenosynovitis of finger   . Urge incontinence of urine     PAST SURGICAL HISTORY:  Past Surgical History:  Procedure Laterality Date  . ABDOMINAL HYSTERECTOMY    . Bladder Tact Surgery    . CATARACT EXTRACTION    . COLONOSCOPY    . COLONOSCOPY WITH PROPOFOL N/A 02/15/2017   Procedure: COLONOSCOPY WITH PROPOFOL;  Surgeon: Lollie Sails, MD;  Location: [facility] ENDOSCOPY;  Service: Endoscopy;  Laterality: N/A;  . TUBAL LIGATION      SOCIAL HISTORY:  Social History   Tobacco Use  . Smoking status: Not on file  Substance Use Topics  . Alcohol use: Not on file    FAMILY HISTORY: No family history on file.  DRUG ALLERGIES:  Allergies  Allergen Reactions  . Iodine     REVIEW OF SYSTEMS:   CONSTITUTIONAL: No fever, fatigue or weakness.  EYES: No blurred or double vision.  EARS, NOSE, AND THROAT: No tinnitus or ear pain.   RESPIRATORY: No cough, shortness of breath, wheezing or hemoptysis.  CARDIOVASCULAR: Positive chest pain, orthopnea, edema.  GASTROINTESTINAL: No nausea, vomiting, diarrhea or abdominal pain.  GENITOURINARY: No dysuria, hematuria.  ENDOCRINE: No polyuria, nocturia,  HEMATOLOGY: No anemia, easy bruising or bleeding SKIN: No rash or lesion. MUSCULOSKELETAL: No joint pain or arthritis.   NEUROLOGIC: No tingling, numbness, weakness.  PSYCHIATRY: No anxiety or depression.   MEDICATIONS AT HOME:  Prior to Admission medications   Medication Sig Start Date End Date Taking? Authorizing Provider  clobetasol cream (TEMOVATE) 1.74 % Apply 1 application topically 2 (two) times daily.    [provider]  fluconazole (DIFLUCAN) 200 MG tablet Take 200 mg by mouth daily.    [provider]  levothyroxine (SYNTHROID, LEVOTHROID) 75 MCG tablet Take 75 mcg by mouth daily before breakfast.    [provider]      PHYSICAL EXAMINATION:   VITAL SIGNS: Blood pressure 140/71, pulse 81, temperature 97.6 F (36.4 C), temperature source Oral, resp. rate 16, height 5\' 4"  (1.626 m), weight 69.9 kg, SpO2 98 %.  GENERAL:  85 y.o.-year-old patient lying in the bed with no acute distress.  EYES: Pupils equal, round, reactive to light and accommodation. No scleral icterus. Extraocular muscles intact.  HEENT: Head atraumatic, normocephalic. Oropharynx and nasopharynx clear.  NECK:  Supple, no jugular venous distention. No thyroid enlargement, no tenderness.  LUNGS: Normal breath sounds bilaterally, no wheezing, rales,rhonchi or crepitation. No use of accessory muscles of respiration.  CARDIOVASCULAR: S1, S2 normal. No murmurs, rubs, or gallops.  ABDOMEN: Soft, nontender, nondistended. Bowel sounds present. No organomegaly or mass.  EXTREMITIES: No pedal edema, cyanosis, or clubbing.  NEUROLOGIC: Cranial nerves II through XII are intact. Muscle strength 5/5 in all extremities. Sensation  intact. Gait not checked.  PSYCHIATRIC: The patient is alert and oriented x 3.  SKIN: No obvious rash, lesion, or ulcer.   LABORATORY PANEL:   CBC Recent Labs  Lab 04/07/18 0854  WBC 7.7  HGB 14.4  HCT 43.7  PLT 246  MCV 94.8  MCH 31.2  MCHC 33.0  RDW 12.8   ------------------------------------------------------------------------------------------------------------------  Chemistries  Recent Labs  Lab 04/07/18 0854  NA 139  K 3.9  CL 105  CO2 25  GLUCOSE 231*  BUN 19  CREATININE 0.92  CALCIUM 9.2   ------------------------------------------------------------------------------------------------------------------ estimated creatinine clearance is 43.7 mL/min (by C-G formula based on SCr of 0.92 mg/dL). ------------------------------------------------------------------------------------------------------------------ No results for input(s): TSH, T4TOTAL, T3FREE, THYROIDAB in the last 72 hours.  Invalid input(s): FREET3   Coagulation profile No results for input(s): INR, PROTIME in the last 168 hours. ------------------------------------------------------------------------------------------------------------------- No results for input(s): DDIMER in the last 72 hours. -------------------------------------------------------------------------------------------------------------------  Cardiac Enzymes Recent Labs  Lab 04/07/18 0854  TROPONINI 0.19*   ------------------------------------------------------------------------------------------------------------------ Invalid input(s): POCBNP  ---------------------------------------------------------------------------------------------------------------  Urinalysis No results found for: COLORURINE, APPEARANCEUR, LABSPEC, PHURINE, GLUCOSEU, HGBUR, BILIRUBINUR, KETONESUR, PROTEINUR, UROBILINOGEN, NITRITE, LEUKOCYTESUR   RADIOLOGY: Dg Chest 2 View  Result Date: 04/07/2018 CLINICAL DATA:  85 year old female with  left-sided chest and arm pain. Intermittent back pain for the past 2 weeks. Nonsmoker. Initial encounter. EXAM: CHEST - 2 VIEW COMPARISON:  None. FINDINGS: No infiltrate, congestive heart failure or pneumothorax. No plain film evidence of pulmonary malignancy. Heart size within normal limits.  Calcified aorta. Limited evaluation upper thoracic spine secondary to overlying shoulders. No osseous abnormality otherwise noted. IMPRESSION: 1. No acute cardiopulmonary abnormality noted. 2.  Aortic Atherosclerosis (ICD10-I70.0). Electronically Signed   By: Genia Del M.D.   On: 04/07/2018 09:13    EKG: Orders placed or performed during the hospital encounter of 04/07/18  . EKG 12-Lead  . EKG 12-Lead  . ED EKG  . ED EKG    IMPRESSION AND PLAN: Patient is 85 year old with history of hypothyroidism and glucose intolerance Resenting with chest pain  1.  Non-ST MI We will treat with aspirin and heparin Nitroglycerin. Start her on Lipitor I have notified Dr. Nehemiah Massed of admission patient will need cardiac catheterization  2.  Hypothyroidism continue Synthroid  3.  Hyperglycemia we will check a hemoglobin A1c Place on sliding scale insulin  4.  Miscellaneous Lovenox for DVT prophylaxis   All the records are reviewed and case discussed with ED provider. Management plans discussed with the patient, family and they are in agreement.  CODE STATUS:    TOTAL TIME TAKING CARE OF THIS PATIENT: 54minutes.    Dustin Flock M.D on 04/07/2018 at 9:49 AM  Between 7am to 6pm - Pager - [phone]  After 6pm go to [email or web address] - password [email or web address]  [employer] Office  [phone]  CC: Primary care physician; Tracie Harrier, MD

## 2018-04-07 NOTE — Consult Note (Signed)
Name: Tracey Robles MRN: [id number] DOB: 1931-03-21    ADMISSION DATE:  04/07/2018 CONSULTATION DATE: 01/202/2020   REFERRING MD : Dr. Posey Pronto   CHIEF COMPLAINT: Chest Pain   BRIEF PATIENT DESCRIPTION:  85 yo female admitted with NSTEMI s/p stent placement to the left anterior descending artery   SIGNIFICANT EVENTS/STUDIES:  01/20-Pt admitted to the stepdown unit post cardiac catheterization  01/20-Cardiac catheterization revealed prox RCA to Mid RCA lesion is 100% stenosed. Ost LAD to Prox LAD lesion is 90% stenosed. Prox LAD lesion is 45% stenosed. Prox Cx lesion is 35% stenosed. Left ventricle with normal LV function ejection fraction of 60% Occluded right coronary artery which looks old with good collaterals from the left anterior descending artery to the PDA. Minimal atherosclerosis of circumflex artery. Significant atherosclerosis and stenosis of proximal LAD  HISTORY OF PRESENT ILLNESS:   This is an 85 yo female with a PMH of Keratosis, Hypothyroidism, Mixed Hyperlipidemia, HTN, and Hyperglycemia.  She presented to [facility] ER on 01/20 with c/o chest pain with radiation to the left arm onset 2 weeks prior to presentation.  However, due to worsening symptoms she proceeded to the ER.  In the ER EKG revealed normal sinus rhythm, no ST elevation. Lab results revealed glucose 231, troponin 0.19, PT 13.7, INR 1.06, and hemoglobin A1C 7.0.  Cardiology consulted after further discussion pt scheduled for cardiac catheterization.  Cardiac cath revealed prox RCA to Mid RCA lesion is 100% stenosed. Ost LAD to Prox LAD lesion is 90% stenosed. Prox LAD lesion is 45% stenosed. Prox Cx lesion is 35% stenosed. Therefore, stent placed in the left anterior descending artery.  She was subsequently admitted to the stepdown unit for additional workup and treatment.   PAST MEDICAL HISTORY :   has a past medical history of Elevated blood sugar, Emotional stress, Hypothyroidism, Keratosis, Stenosing tenosynovitis  of finger, and Urge incontinence of urine.  has a past surgical history that includes Abdominal hysterectomy; Bladder Tact Surgery; Cataract extraction; Colonoscopy; Tubal ligation; and Colonoscopy with propofol (N/A, 02/15/2017). Prior to Admission medications   Medication Sig Start Date End Date Taking? Authorizing Provider  levothyroxine (SYNTHROID, LEVOTHROID) 75 MCG tablet Take 75 mcg by mouth daily before breakfast.   Yes [provider]  clobetasol cream (TEMOVATE) 8.41 % Apply 1 application topically 2 (two) times daily.    [provider]  fluconazole (DIFLUCAN) 200 MG tablet Take 200 mg by mouth daily.    [provider]   Allergies  Allergen Reactions  . Iodine     FAMILY HISTORY:  family history is not on file. SOCIAL HISTORY:  reports that she has never smoked. She has never used smokeless tobacco.  REVIEW OF SYSTEMS: Positives in BOLD   Constitutional: Negative for fever, chills, weight loss, malaise/fatigue and diaphoresis.  HENT: Negative for hearing loss, ear pain, nosebleeds, congestion, sore throat, neck pain, tinnitus and ear discharge.   Eyes: Negative for blurred vision, double vision, photophobia, pain, discharge and redness.  Respiratory: Negative for cough, hemoptysis, sputum production, shortness of breath, wheezing and stridor.   Cardiovascular: chest pain, palpitations, orthopnea, claudication, leg swelling and PND.  Gastrointestinal: Negative for heartburn, nausea, vomiting, abdominal pain, diarrhea, constipation, blood in stool and melena.  Genitourinary: Negative for dysuria, urgency, frequency, hematuria and flank pain.  Musculoskeletal: Negative for myalgias, back pain, joint pain and falls.  Skin: Negative for itching and rash.  Neurological: Negative for dizziness, tingling, tremors, sensory change, speech change, focal weakness, seizures, loss of  consciousness, weakness and headaches.  Endo/Heme/Allergies: Negative for  environmental allergies and polydipsia. Does not bruise/bleed easily.  SUBJECTIVE:  No complaints at this time  VITAL SIGNS: Temp:  [97.1 F (36.2 C)-98 F (36.7 C)] 97.7 F (36.5 C) (01/20 2029) Pulse Rate:  [60-81] 69 (01/20 2029) Resp:  [12-18] 13 (01/20 2029) BP: (117-166)/(65-77) 136/66 (01/20 2029) SpO2:  [94 %-100 %] 99 % (01/20 2029) Weight:  [69.9 kg-71.2 kg] 71.2 kg (01/20 1854)  PHYSICAL EXAMINATION: General: well developed, well nourished female, NAD Neuro: alert and oriented, follows commands  HEENT: supple, no JVD  Cardiovascular: nsr, rrr, no R/G  Lungs: clear throughout, even, non labored  Abdomen: +BS x4, soft, non tender, non distended  Musculoskeletal: normal bulk and tone, no edema  Skin: no hematoma or bleeding present at right groin cath site   Recent Labs  Lab 04/07/18 0854  NA 139  K 3.9  CL 105  CO2 25  BUN 19  CREATININE 0.92  GLUCOSE 231*   Recent Labs  Lab 04/07/18 0854  HGB 14.4  HCT 43.7  WBC 7.7  PLT 246   Dg Chest 2 View  Result Date: 04/07/2018 CLINICAL DATA:  85 year old female with left-sided chest and arm pain. Intermittent back pain for the past 2 weeks. Nonsmoker. Initial encounter. EXAM: CHEST - 2 VIEW COMPARISON:  None. FINDINGS: No infiltrate, congestive heart failure or pneumothorax. No plain film evidence of pulmonary malignancy. Heart size within normal limits.  Calcified aorta. Limited evaluation upper thoracic spine secondary to overlying shoulders. No osseous abnormality otherwise noted. IMPRESSION: 1. No acute cardiopulmonary abnormality noted. 2.  Aortic Atherosclerosis (ICD10-I70.0). Electronically Signed   By: Genia Del M.D.   On: 04/07/2018 09:13    ASSESSMENT / PLAN:  NSTEMI s/p cardiac catheterization (stent placement in the left anterior descending artery) Continuous telemetry monitoring  Cardiology consulted appreciate input-cardiac medications per recommendations  Continue angiomax per cardiology  recommendations   Hypothyroidism  Continue synthroid   [name], [place] Pager [phone] (please enter 7 digits) PCCM Consult Pager [phone] (please enter 7 digits)

## 2018-04-07 NOTE — Progress Notes (Signed)
As admitting patient to specials for heart cath with Dr Nehemiah Massed, noting patient having eaten lunch prior to coming down for procedure.notified Dr Nehemiah Massed as well as cath team.

## 2018-04-07 NOTE — Progress Notes (Signed)
ANTICOAGULATION CONSULT NOTE - Initial Consult  Pharmacy Consult for Heparin Indication: chest pain/ACS  Allergies  Allergen Reactions  . Iodine     Patient Measurements: Height: 5\' 4"  (162.6 cm) Weight: 154 lb (69.9 kg) IBW/kg (Calculated) : 54.7 Heparin Dosing Weight: 68.8 kg  Vital Signs: Temp: 97.8 F (36.6 C) (01/20 1056) Temp Source: Oral (01/20 1056) BP: 150/68 (01/20 1056) Pulse Rate: 60 (01/20 1056)  Labs: Recent Labs    04/07/18 0854  HGB 14.4  HCT 43.7  PLT 246  CREATININE 0.92  TROPONINI 0.19*    Estimated Creatinine Clearance: 43.7 mL/min (by C-G formula based on SCr of 0.92 mg/dL).   Medical History: Past Medical History:  Diagnosis Date  . Elevated blood sugar   . Emotional stress   . Hypothyroidism   . Keratosis   . Stenosing tenosynovitis of finger   . Urge incontinence of urine     Assessment: Patient is a 85yo female admitted with chest paing. Pharmacy consulted for Heparin dosing. No anticoagulants prior to admission.  Goal of Therapy:  Heparin level 0.3-0.7 units/ml Monitor platelets by anticoagulation protocol: Yes   Plan:  Give 4000 units bolus x 1 Start heparin infusion at 800 units/hr Check anti-Xa level in 8 hours and daily while on heparin Continue to monitor H&H and platelets  Vira Blanco 04/07/2018,11:02 AM

## 2018-04-07 NOTE — ED Notes (Signed)
Admitting MD at bedside.

## 2018-04-07 NOTE — Progress Notes (Signed)
Advanced care plan.  Purpose of the Encounter: CODE STATUS  Parties in Attendance: Patient herself  Patient's Decision Capacity: Intact  Subjective/Patient's story:  Patient is 85 year old with history of hypothyroidism and hyperglycemia presents to the ED with chest pain noted to have a non-ST MI  Objective/Medical story  I discussed this with patient regarding her desires for cardiac and pulmonary resuscitation  Goals of care determination:   Patient states that based on her age she would not want any type of aggressive measures and wants to be a DNR  CODE STATUS: DNR   Time spent discussing advanced care planning: 16 minutes

## 2018-04-07 NOTE — Progress Notes (Signed)
[facility] Encounter Note  Patient: Tracey Robles [id number] / Admit Date: 04/07/2018 / Date of Encounter: 04/07/2018, 5:19 PM   Subjective: Patient has full resolution of chest discomfort after admission to the hospital.  Peak Troponin at 0.19 consistent with non-STEMI Cardiac catheterization showing normal LV systolic function with ejection fraction of 60% Previously occluded right coronary artery with good collaterals from left anterior descending artery to PDA Significant new stenosis of left anterior descending artery of 90% Minimal atherosclerosis of left circumflex artery of 35%  Review of Systems: Positive for: None Negative for: Vision change, hearing change, syncope, dizziness, nausea, vomiting,diarrhea, bloody stool, stomach pain, cough, congestion, diaphoresis, urinary frequency, urinary pain,skin lesions, skin rashes Others previously listed  Objective: Telemetry: Normal sinus rhythm Physical Exam: Blood pressure (!) 150/68, pulse 69, temperature 98 F (36.7 C), temperature source Oral, resp. rate 16, height 5\' 4"  (1.626 m), weight 70.3 kg, SpO2 98 %. Body mass index is 26.61 kg/m. General: Well developed, well nourished, in no acute distress. Head: Normocephalic, atraumatic, sclera non-icteric, no xanthomas, nares are without discharge. Neck: No apparent masses Lungs: Normal respirations with no wheezes, no rhonchi, no rales , no crackles   Heart: Regular rate and rhythm, normal S1 S2, no murmur, no rub, no gallop, PMI is normal size and placement, carotid upstroke normal without bruit, jugular venous pressure normal Abdomen: Soft, non-tender, non-distended with normoactive bowel sounds. No hepatosplenomegaly. Abdominal aorta is normal size without bruit Extremities: No edema, no clubbing, no cyanosis, no ulcers,  Peripheral: 2+ radial, 2+ femoral, 2+ dorsal pedal pulses Neuro: Alert and oriented. Moves all extremities spontaneously. Psych:  Responds to  questions appropriately with a normal affect.  No intake or output data in the 24 hours ending 04/07/18 1719  Inpatient Medications:  . [MAR Hold] aspirin  324 mg Oral NOW   Or  . [MAR Hold] aspirin  300 mg Rectal NOW  . [START ON 04/08/2018] aspirin  81 mg Oral Pre-Cath  . [MAR Hold] aspirin EC  81 mg Oral Daily  . [MAR Hold] atorvastatin  40 mg Oral q1800  . [MAR Hold] heparin  4,000 Units Intravenous Once  . [MAR Hold] levothyroxine  75 mcg Oral QAC breakfast  . sodium chloride flush  3 mL Intravenous Q12H   Infusions:  . sodium chloride    . [START ON 04/08/2018] sodium chloride 3 mL/kg/hr (04/07/18 1314)   Followed by  . [START ON 04/08/2018] sodium chloride    . bivalirudin (ANGIOMAX) infusion 5 mg/mL 1.75 mg/kg/hr (04/07/18 1716)  . heparin      Labs: Recent Labs    04/07/18 0854  NA 139  K 3.9  CL 105  CO2 25  GLUCOSE 231*  BUN 19  CREATININE 0.92  CALCIUM 9.2   No results for input(s): AST, ALT, ALKPHOS, BILITOT, PROT, ALBUMIN in the last 72 hours. Recent Labs    04/07/18 0854  WBC 7.7  HGB 14.4  HCT 43.7  MCV 94.8  PLT 246   Recent Labs    04/07/18 0854  TROPONINI 0.19*   Invalid input(s): POCBNP Recent Labs    04/07/18 0854  HGBA1C 7.0*     Weights: Filed Weights   04/07/18 0844 04/07/18 1056 04/07/18 1301  Weight: 69.9 kg 70.4 kg 70.3 kg     Radiology/Studies:  Dg Chest 2 View  Result Date: 04/07/2018 CLINICAL DATA:  85 year old female with left-sided chest and arm pain. Intermittent back pain for the past 2 weeks. Nonsmoker. Initial encounter.  EXAM: CHEST - 2 VIEW COMPARISON:  None. FINDINGS: No infiltrate, congestive heart failure or pneumothorax. No plain film evidence of pulmonary malignancy. Heart size within normal limits.  Calcified aorta. Limited evaluation upper thoracic spine secondary to overlying shoulders. No osseous abnormality otherwise noted. IMPRESSION: 1. No acute cardiopulmonary abnormality noted. 2.  Aortic  Atherosclerosis (ICD10-I70.0). Electronically Signed   By: Genia Del M.D.   On: 04/07/2018 09:13     Assessment and Recommendation  85 y.o. female with essential hypertension mixed hyperlipidemia and non-ST elevation myocardial infarction with two-vessel coronary artery disease.  We have discussed all options with the patient for further intervention and treatment of non-ST elevation myocardial infarction.  This includes the possibility of two-vessel coronary to bypass grafting as well as PCI and stent placement of left anterior descending artery and continued treatment medically of collateral flow to LAD to PDA.  The patient wishes to pursue PCI and stent placement left anterior descending artery and not pursue bypass surgery at this time if able.  This is secondary to the patient known not to have had any medication.  I have cardiovascular risk factors prior to admission patient all knows all the risks and benefits of current choice of treatment including the possibility of death stroke heart attack infection bleeding blood clot from a cardiac catheterization and intervention as well as the possibility of further restenosis or myocardial infarction from treatment.  Additionally she may have some anginal symptoms medically managed with additional medications listed below   1.  PCI and stent placed in the left anterior descending artery 2.  Continue medical management of collateral flow to PDA 3.  Dual antiplatelet therapy 4.  High intensity cholesterol therapy 5.  Cardiac rehabilitation  Signed, Serafina Royals M.D. FACC

## 2018-04-07 NOTE — Progress Notes (Signed)
Patient clinically stable post heart cath with Dr Clayborn Bigness placing 2 DES' to Proximal LAD. Vitals stable. Awake, alert and oriented post procedure. Denies complaints of chest pain at this time. No bleeding nor hematoma at right groin site. Sinus rhythm per monitor. angiomax gtt to infuse X 2hours in CCU stepdown, after which patient may return to telemetry. Report called to [facility] Flowers RN in ccu with plan reviewed,questions answered.Dr Clayborn Bigness out to speak with patient post procedure with questions answered.

## 2018-04-07 NOTE — Plan of Care (Signed)
  Problem: Health Behavior/Discharge Planning: Goal: Ability to manage health-related needs will improve Outcome: Progressing Note:  Patient for heart catheterization this afternoon. Patient in that area now. Scheduled to begin catheterization at 4 PM d/t eating lunch. Will resume care upon return to the unit. [facility]acey Robles [facility]

## 2018-04-07 NOTE — Progress Notes (Addendum)
Spoke with Hinton Dyer, NP on phone and made her aware that patient is complaining of indigestion that hurts her chest and that goes up to her head and her head is hurting some and that she just thinks if she could sit up she would feel better.  Patient can not sit up until 2145 because of right groin site.  Patient in reverse trendelenburg, NSR with occasional PVCs and VSS.  No shortness of breath. NP stated she would order tums and something for anxiety.

## 2018-04-07 NOTE — ED Notes (Signed)
Patient transported to X-ray 

## 2018-04-07 NOTE — Consult Note (Signed)
[facility] Consultation Note  Patient ID: Tracey Robles, MRN: [id number], DOB/AGE: 19-Aug-1931 85 y.o. Admit date: 04/07/2018   Date of Consult: 04/07/2018 Primary Physician: Tracie Harrier, MD Primary Cardiologist: None  Chief Complaint:  Chief Complaint  Patient presents with  . Chest Pain   Reason for Consult: Chest pain  HPI: 85 y.o. female with essential hypertension mixed hyperlipidemia but no history of cardiovascular disease having intermittent episodes of chest pain shortness of breath with and without physical activity over the last 2 weeks.  She claims that she got up this morning and was awakened by significant substernal chest pain radiating into her back as well as associated shortness of breath.  This lasted for quite some time and she was seen in the emergency room with an EKG showing normal sinus rhythm.  Troponin has elevated to 0.19 and she has had full relief of symptoms with appropriate medication management.  Currently she is hemodynamically stable and this is consistent with possible non-ST elevation myocardial infarction  Past Medical History:  Diagnosis Date  . Elevated blood sugar   . Emotional stress   . Hypothyroidism   . Keratosis   . Stenosing tenosynovitis of finger   . Urge incontinence of urine       Surgical History:  Past Surgical History:  Procedure Laterality Date  . ABDOMINAL HYSTERECTOMY    . Bladder Tact Surgery    . CATARACT EXTRACTION    . COLONOSCOPY    . COLONOSCOPY WITH PROPOFOL N/A 02/15/2017   Procedure: COLONOSCOPY WITH PROPOFOL;  Surgeon: Lollie Sails, MD;  Location: [facility] ENDOSCOPY;  Service: Endoscopy;  Laterality: N/A;  . TUBAL LIGATION       Home Meds: Prior to Admission medications   Medication Sig Start Date End Date Taking? Authorizing Provider  levothyroxine (SYNTHROID, LEVOTHROID) 75 MCG tablet Take 75 mcg by mouth daily before breakfast.   Yes [provider]  clobetasol cream  (TEMOVATE) 4.09 % Apply 1 application topically 2 (two) times daily.    [provider]  fluconazole (DIFLUCAN) 200 MG tablet Take 200 mg by mouth daily.    [provider]    Inpatient Medications:  . aspirin  324 mg Oral NOW   Or  . aspirin  300 mg Rectal NOW  . [START ON 04/08/2018] aspirin EC  81 mg Oral Daily  . atorvastatin  40 mg Oral q1800  . heparin  4,000 Units Intravenous Once  . [START ON 04/08/2018] levothyroxine  75 mcg Oral QAC breakfast   . heparin      Allergies:  Allergies  Allergen Reactions  . Iodine     Social History   Socioeconomic History  . Marital status: Widowed    Spouse name: Not on file  . Number of children: Not on file  . Years of education: Not on file  . Highest education level: Not on file  Occupational History  . Not on file  Social Needs  . Financial resource strain: Not on file  . Food insecurity:    Worry: Not on file    Inability: Not on file  . Transportation needs:    Medical: Not on file    Non-medical: Not on file  Tobacco Use  . Smoking status: Never Smoker  . Smokeless tobacco: Never Used  Substance and Sexual Activity  . Alcohol use: Not on file  . Drug use: Not on file  . Sexual activity: Not on file  Lifestyle  . Physical  activity:    Days per week: Not on file    Minutes per session: Not on file  . Stress: Not on file  Relationships  . Social connections:    Talks on phone: Not on file    Gets together: Not on file    Attends religious service: Not on file    Active member of club or organization: Not on file    Attends meetings of clubs or organizations: Not on file    Relationship status: Not on file  . Intimate partner violence:    Fear of current or ex partner: Not on file    Emotionally abused: Not on file    Physically abused: Not on file    Forced sexual activity: Not on file  Other Topics Concern  . Not on file  Social History Narrative  . Not on file     History reviewed.  No pertinent family history.   Review of Systems Positive for chest pain shortness of breath Negative for: General:  chills, fever, night sweats or weight changes.  Cardiovascular: PND orthopnea syncope dizziness  Dermatological skin lesions rashes Respiratory: Cough congestion Urologic: Frequent urination urination at night and hematuria Abdominal: negative for nausea, vomiting, diarrhea, bright red blood per rectum, melena, or hematemesis Neurologic: negative for visual changes, and/or hearing changes  All other systems reviewed and are otherwise negative except as noted above.  Labs: Recent Labs    04/07/18 0854  TROPONINI 0.19*   Lab Results  Component Value Date   WBC 7.7 04/07/2018   HGB 14.4 04/07/2018   HCT 43.7 04/07/2018   MCV 94.8 04/07/2018   PLT 246 04/07/2018    Recent Labs  Lab 04/07/18 0854  NA 139  K 3.9  CL 105  CO2 25  BUN 19  CREATININE 0.92  CALCIUM 9.2  GLUCOSE 231*   No results found for: CHOL, HDL, LDLCALC, TRIG No results found for: DDIMER  Radiology/Studies:  Dg Chest 2 View  Result Date: 04/07/2018 CLINICAL DATA:  85 year old female with left-sided chest and arm pain. Intermittent back pain for the past 2 weeks. Nonsmoker. Initial encounter. EXAM: CHEST - 2 VIEW COMPARISON:  None. FINDINGS: No infiltrate, congestive heart failure or pneumothorax. No plain film evidence of pulmonary malignancy. Heart size within normal limits.  Calcified aorta. Limited evaluation upper thoracic spine secondary to overlying shoulders. No osseous abnormality otherwise noted. IMPRESSION: 1. No acute cardiopulmonary abnormality noted. 2.  Aortic Atherosclerosis (ICD10-I70.0). Electronically Signed   By: Genia Del M.D.   On: 04/07/2018 09:13    EKG: Normal sinus rhythm  Weights: Filed Weights   04/07/18 0844 04/07/18 1056  Weight: 69.9 kg 70.4 kg     Physical Exam: Blood pressure (!) 150/68, pulse 60, temperature 97.8 F (36.6 C), temperature source  Oral, resp. rate 17, height 5\' 4"  (1.626 m), weight 70.4 kg, SpO2 99 %. Body mass index is 26.64 kg/m. General: Well developed, well nourished, in no acute distress. Head eyes ears nose throat: Normocephalic, atraumatic, sclera non-icteric, no xanthomas, nares are without discharge. No apparent thyromegaly and/or mass  Lungs: Normal respiratory effort.  no wheezes, no rales, no rhonchi.  Heart: RRR with normal S1 S2. no murmur gallop, no rub, PMI is normal size and placement, carotid upstroke normal without bruit, jugular venous pressure is normal Abdomen: Soft, non-tender, non-distended with normoactive bowel sounds. No hepatomegaly. No rebound/guarding. No obvious abdominal masses. Abdominal aorta is normal size without bruit Extremities: No edema. no cyanosis, no clubbing,  no ulcers  Peripheral : 2+ bilateral upper extremity pulses, 2+ bilateral femoral pulses, 2+ bilateral dorsal pedal pulse Neuro: Alert and oriented. No facial asymmetry. No focal deficit. Moves all extremities spontaneously. Musculoskeletal: Normal muscle tone without kyphosis Psych:  Responds to questions appropriately with a normal affect.    Assessment: 85 year old female with essential hypertension mixed hyperlipidemia with a non-ST elevation myocardial infarction currently stable  Plan: 1.  Continue nitrates for chest pain 2.  Aspirin for further risk reduction cardiovascular event 3.  Beta-blocker for heart rate control and hypertension control 4.  High intensity cholesterol therapy 5.  Proceed to cardiac catheterization to assess coronary anatomy and further treatment thereof is necessary.  Patient understands the risk and benefits of cardiac catheterization.  This includes a possibility death stroke heart attack infection bleeding or blood clot.  She is at low risk for conscious sedation  Signed, Corey Skains M.D. [facility] 04/07/2018, 12:30 PM

## 2018-04-07 NOTE — ED Triage Notes (Signed)
Patient reports chest pain with left arm and back intermittently for the last 2 weeks. Patient reports this morning pain is worse. Denies any cardiac history. Denies SOB, N/V.

## 2018-04-07 NOTE — ED Provider Notes (Signed)
[facility] Emergency Department Provider Note  ____________________________________________   I have reviewed the triage vital signs and the nursing notes.   HISTORY  Chief Complaint Chest Pain   History limited by: Not Limited   HPI Tracey Robles is a 85 y.o. female who presents to the emergency department today because of concerns for chest pain.  Patient states is located in the left chest.  There is radiation into her left arm and back.  The patient says that the pain started roughly 2 weeks ago.  Since then it has been somewhat intermittent.  She states this morning it was worse.  She did drive herself to the hospital.  She denies any associated shortness of breath or diaphoresis.  She denies similar pain in the past.  The time I examined she states the pain is no longer present.   Per medical record review patient has a history of elevated blood sugar.   Past Medical History:  Diagnosis Date  . Elevated blood sugar   . Emotional stress   . Hypothyroidism   . Keratosis   . Stenosing tenosynovitis of finger   . Urge incontinence of urine     There are no active problems to display for this patient.   Past Surgical History:  Procedure Laterality Date  . ABDOMINAL HYSTERECTOMY    . Bladder Tact Surgery    . CATARACT EXTRACTION    . COLONOSCOPY    . COLONOSCOPY WITH PROPOFOL N/A 02/15/2017   Procedure: COLONOSCOPY WITH PROPOFOL;  Surgeon: Lollie Sails, MD;  Location: [facility] ENDOSCOPY;  Service: Endoscopy;  Laterality: N/A;  . TUBAL LIGATION      Prior to Admission medications   Medication Sig Start Date End Date Taking? Authorizing Provider  clobetasol cream (TEMOVATE) 0.27 % Apply 1 application topically 2 (two) times daily.    [provider]  fluconazole (DIFLUCAN) 200 MG tablet Take 200 mg by mouth daily.    [provider]  levothyroxine (SYNTHROID, LEVOTHROID) 75 MCG tablet Take 75 mcg by mouth daily before  breakfast.    [provider]    Allergies Iodine  No family history on file.  Social History Social History   Tobacco Use  . Smoking status: Not on file  Substance Use Topics  . Alcohol use: Not on file  . Drug use: Not on file    Review of Systems Constitutional: No fever/chills Eyes: No visual changes. ENT: No sore throat. Cardiovascular: Positive for chest pain. Respiratory: Denies shortness of breath. Gastrointestinal: No abdominal pain.  No nausea, no vomiting.  No diarrhea.   Genitourinary: Negative for dysuria. Musculoskeletal: Negative for back pain. Skin: Negative for rash. Neurological: Negative for headaches, focal weakness or numbness.  ____________________________________________   PHYSICAL EXAM:  VITAL SIGNS: ED Triage Vitals  Enc Vitals Group     BP 04/07/18 0843 140/71     Pulse Rate 04/07/18 0843 81     Resp 04/07/18 0843 16     Temp 04/07/18 0843 97.6 F (36.4 C)     Temp Source 04/07/18 0843 Oral     SpO2 04/07/18 0843 98 %     Weight 04/07/18 0844 154 lb (69.9 kg)     Height 04/07/18 0844 5\' 4"  (1.626 m)     Head Circumference --      Peak Flow --      Pain Score 04/07/18 0844 7   Constitutional: Alert and oriented.  Eyes: Conjunctivae are normal.  ENT  Head: Normocephalic and atraumatic.      Nose: No congestion/rhinnorhea.      Mouth/Throat: Mucous membranes are moist.      Neck: No stridor. Hematological/Lymphatic/Immunilogical: No cervical lymphadenopathy. Cardiovascular: Normal rate, regular rhythm.  No murmurs, rubs, or gallops. Respiratory: Normal respiratory effort without tachypnea nor retractions. Breath sounds are clear and equal bilaterally. No wheezes/rales/rhonchi. Gastrointestinal: Soft and non tender. No rebound. No guarding.  Genitourinary: Deferred Musculoskeletal: Normal range of motion in all extremities. No lower extremity edema. Neurologic:  Normal speech and language. No gross focal neurologic  deficits are appreciated.  Skin:  Skin is warm, dry and intact. No rash noted. Psychiatric: Mood and affect are normal. Speech and behavior are normal. Patient exhibits appropriate insight and judgment.  ____________________________________________    LABS (pertinent positives/negatives)  CBC wbc 7.7, hgb 14.4, plt 246 BMP wnl except glu 231 Trop 0.19 ____________________________________________   EKG  I, Nance Pear, attending physician, personally viewed and interpreted this EKG  EKG Time: 0840 Rate: 84 Rhythm: normal sinus rhythm Axis: normal Intervals: qtc 441 QRS: narrow ST changes: no st elevation Impression: normal ekg   ____________________________________________    RADIOLOGY  CXR No acute abnormality  ____________________________________________   PROCEDURES  Procedures  CRITICAL CARE Performed by: Nance Pear   Total critical care time: 20 minutes  Critical care time was exclusive of separately billable procedures and treating other patients.  Critical care was necessary to treat or prevent imminent or life-threatening deterioration.  Critical care was time spent personally by me on the following activities: development of treatment plan with patient and/or surrogate as well as nursing, discussions with consultants, evaluation of patient's response to treatment, examination of patient, obtaining history from patient or surrogate, ordering and performing treatments and interventions, ordering and review of laboratory studies, ordering and review of radiographic studies, pulse oximetry and re-evaluation of patient's condition.  ____________________________________________   INITIAL IMPRESSION / ASSESSMENT AND PLAN / ED COURSE  Pertinent labs & imaging results that were available during my care of the patient were reviewed by me and considered in my medical decision making (see chart for details).   Patient presented to the emergency  department today because of concerns for chest pain.  Differential would be broad including pneumonia, pneumothorax, ACS, pericarditis, esophagitis amongst other etiologies.  EKG without any concerning acute findings.  Blood work however did show an elevated troponin of 0.19.  Patient will be given aspirin.  Patient continues to be pain-free.  Will admit to the hospital service.   ____________________________________________   FINAL CLINICAL IMPRESSION(S) / ED DIAGNOSES  Final diagnoses:  Chest pain, unspecified type  Elevated troponin     Note: This dictation was prepared with Dragon dictation. Any transcriptional errors that result from this process are unintentional     Nance Pear, MD 04/07/18 [phone]

## 2018-04-08 ENCOUNTER — Encounter: Payer: Self-pay | Admitting: Internal Medicine

## 2018-04-08 DIAGNOSIS — Z955 Presence of coronary angioplasty implant and graft: Secondary | ICD-10-CM

## 2018-04-08 LAB — CBC WITH DIFFERENTIAL/PLATELET
Abs Immature Granulocytes: 0.03 10*3/uL (ref 0.00–0.07)
BASOS PCT: 0 %
Basophils Absolute: 0 10*3/uL (ref 0.0–0.1)
Eosinophils Absolute: 0.2 10*3/uL (ref 0.0–0.5)
Eosinophils Relative: 3 %
HCT: 39.1 % (ref 36.0–46.0)
Hemoglobin: 12.8 g/dL (ref 12.0–15.0)
Immature Granulocytes: 0 %
Lymphocytes Relative: 23 %
Lymphs Abs: 2 10*3/uL (ref 0.7–4.0)
MCH: 30.8 pg (ref 26.0–34.0)
MCHC: 32.7 g/dL (ref 30.0–36.0)
MCV: 94 fL (ref 80.0–100.0)
MONO ABS: 0.6 10*3/uL (ref 0.1–1.0)
Monocytes Relative: 8 %
NEUTROS ABS: 5.5 10*3/uL (ref 1.7–7.7)
Neutrophils Relative %: 66 %
PLATELETS: 225 10*3/uL (ref 150–400)
RBC: 4.16 MIL/uL (ref 3.87–5.11)
RDW: 12.8 % (ref 11.5–15.5)
WBC: 8.4 10*3/uL (ref 4.0–10.5)
nRBC: 0 % (ref 0.0–0.2)

## 2018-04-08 LAB — BASIC METABOLIC PANEL
Anion gap: 7 (ref 5–15)
BUN: 16 mg/dL (ref 8–23)
CO2: 26 mmol/L (ref 22–32)
Calcium: 8.6 mg/dL — ABNORMAL LOW (ref 8.9–10.3)
Chloride: 108 mmol/L (ref 98–111)
Creatinine, Ser: 0.76 mg/dL (ref 0.44–1.00)
GFR calc Af Amer: >60 mL/min (ref >60)
GFR calc non Af Amer: >60 mL/min (ref >60)
Glucose, Bld: 114 mg/dL — ABNORMAL HIGH (ref 70–99)
Potassium: 3.6 mmol/L (ref 3.5–5.1)
Sodium: 141 mmol/L (ref 135–145)

## 2018-04-08 LAB — LIPID PANEL
Cholesterol: 182 mg/dL (ref 0–200)
HDL: 28 mg/dL — ABNORMAL LOW (ref >40)
LDL Cholesterol: 119 mg/dL — ABNORMAL HIGH (ref 0–99)
Total CHOL/HDL Ratio: 6.5 RATIO
Triglycerides: 177 mg/dL — ABNORMAL HIGH (ref <150)
VLDL: 35 mg/dL (ref 0–40)

## 2018-04-08 LAB — GLUCOSE, CAPILLARY: Glucose-Capillary: 95 mg/dL (ref 70–99)

## 2018-04-08 MED ORDER — CLOPIDOGREL BISULFATE 75 MG PO TABS
75.0000 mg | ORAL_TABLET | Freq: Every day | ORAL | Status: DC
  Administered 2018-04-08 – 2018-04-09 (×2): 75 mg via ORAL
  Filled 2018-04-08 (×3): qty 1

## 2018-04-08 MED ORDER — POLYETHYLENE GLYCOL 3350 17 G PO PACK: 17.0000 g | PACK | Freq: Every day | ORAL | Status: DC | PRN

## 2018-04-08 MED ORDER — ISOSORBIDE MONONITRATE ER 30 MG PO TB24
30.0000 mg | ORAL_TABLET | Freq: Every day | ORAL | Status: DC
  Administered 2018-04-08 – 2018-04-09 (×2): 30 mg via ORAL
  Filled 2018-04-08 (×2): qty 1

## 2018-04-08 MED ORDER — METOPROLOL SUCCINATE ER 25 MG PO TB24
25.0000 mg | ORAL_TABLET | Freq: Every day | ORAL | Status: DC
  Administered 2018-04-08 – 2018-04-09 (×2): 25 mg via ORAL
  Filled 2018-04-08 (×2): qty 1

## 2018-04-08 MED ORDER — PSYLLIUM 95 % PO PACK
1.0000 | PACK | Freq: Every day | ORAL | Status: DC | PRN
  Filled 2018-04-08: qty 1

## 2018-04-08 MED ORDER — POLYETHYLENE GLYCOL 3350 17 G PO PACK: 17.0000 g | PACK | Freq: Every day | ORAL | Status: DC

## 2018-04-08 NOTE — Progress Notes (Signed)
Patient stated she had mild chest pain on the left side of chest. No diaphoresis noted.  Gave nitroglycerin sublingual and calcium carbonate (Tums) tablet at 0700 and patient stated lessened chest pain. Pain intervention was successful and will continue to monitor.

## 2018-04-08 NOTE — Progress Notes (Signed)
Cardiovascular and Pulmonary Nurse Navigator Note:   85 year old female with PMHx of keratosis, hypothyroidism, mixed hyperlipidemia, HTN, and Hyperglycemia.  Patient presented to [facility] ER on 04/07/2018 with c/o chest pain with radiation to the left arm for past two weeks.  EKG on presentation to the ER revealed NSR with no ST elevation.  Troponin 0.19.  HGB A1C 7.0.  Patient underwent cardiac cath on 04/07/2018 which revealed:    Conclusion     Prox RCA to Mid RCA lesion is 100% stenosed.  Ost LAD to Prox LAD lesion is 90% stenosed.  Prox LAD lesion is 45% stenosed.  Prox Cx lesion is 35% stenosed.   85 year old female with progressive anginal symptoms with non-ST elevation myocardial infarction  Left ventricle with normal LV function ejection fraction of 60%  Occluded right coronary artery which looks old with good collaterals from the left anterior descending artery to the PDA  Minimal atherosclerosis of circumflex artery  Significant atherosclerosis and stenosis of proximal LAD  Plan PCI and stent placement of left anterior descending artery Medical management occluded right coronary artery with good collaterals from LAD Cardiac rehabilitation Dual antiplatelet therapy High intensity cholesterol therapy    Procedures   CORONARY STENT INTERVENTION  Conclusion     Prox LAD lesion is 90% stenosed.  Post intervention, there is a 0% residual stenosis.  Two overlapping stents were placed to cover the mid LAD lesion   Successful PCI and stent of mid LAD with DES stents Reducing lesion from 90 down to 0%   Rounded on patient.  Patient sitting up in recliner.  Son and two friends at bedside.  Patient gave this RN permission to speak in front of family and friends present.   "Heart Attack Bouncing Back" booklet given and reviewed with patient/family. Discussed the definition of CAD. Reviewed the location of CAD and where his stent was placed. Informed patient he will  be given a stent card. Explained the purpose of the stent card. Instructed patient to keep stent card in his wallet.  ? Discussed modifiable risk factors including controlling blood pressure, cholesterol, and blood sugar; following heart healthy diet; maintaining healthy weight; exercise; and smoking cessation, if applicable.   ? Discussed cardiac medications including rationale for taking, mechanisms of action, and side effects. Stressed the importance of taking medications as prescribed. Written materials on Metoprolol, Plavix, Imdur, Lipitor given and reviewed with patient.   ? Discussed emergency plan for heart attack symptoms. Patient verbalized understanding of need to call 911 and not to drive herself to ER if having cardiac symptoms / chest pain. Note:  Patient drove herself to ER.  This RN has informed patient given her CAD going forward she needs to call 911 if she is having chest pain.   ? Heart healthy diet of low sodium, low fat, low cholesterol heart healthy carb modified diet discussed. Information on diet provided.   Patient sees Dr. Ginette Pitman, PCP.  Patient reported Dr. Ginette Pitman is aware of her elevated blood sugar and Hgb A1C.  Patient stated, "I saw Dr. Ginette Pitman in October.  He wanted to start me on a medication to lower my blood sugar, but I refused.  I know I need to change my diet."  Note: Dietitian Consultation for diet education pending ? Smoking Cessation - Patient is a NEVER smoker.   ? Exercise - Benefits of exercised discussed. Patient reported to this RN she has been exercising 4 days per week at a gym using her Silver  Sneakers coverage provided by her health insurance.  Patient also added she is very active at home and in her yard.   Informed patient that her cardiologist has referred her to outpatient Cardiac Rehab. An overview of the program was provided. Patient has declined participation in Cardiac Rehab and plans to continue exercising at the gym 4 days per week.    Patient  appreciative of the information.  ? Roanna Epley, RN, BSN, [place]  [facility] Cardiac & Pulmonary Rehab  Cardiovascular & Pulmonary Nurse Navigator  Direct Line: [phone]  Department Phone #: [phone] Fax: [phone]  Email Address: [email or web address]

## 2018-04-08 NOTE — Progress Notes (Deleted)
[facility] Encounter Note  Patient: Tracey Robles [id number] / Admit Date: 04/07/2018 / Date of Encounter: 04/08/2018, 8:22 AM   Subjective: Patient has full resolution of chest discomfort after admission to the hospital.  Peak Troponin at 0.19 consistent with non-STEMI Cardiac catheterization showing normal LV systolic function with ejection fraction of 60% Previously occluded right coronary artery with good collaterals from left anterior descending artery to PDA Significant new stenosis of left anterior descending artery of 90% Minimal atherosclerosis of left circumflex artery of 35% Successful PCI and stent placement left anterior descending artery with continue medication management occluded right coronary artery with good collateralization Review of Systems: Positive for: None Negative for: Vision change, hearing change, syncope, dizziness, nausea, vomiting,diarrhea, bloody stool, stomach pain, cough, congestion, diaphoresis, urinary frequency, urinary pain,skin lesions, skin rashes Others previously listed  Objective: Telemetry: Normal sinus rhythm Physical Exam: Blood pressure (!) 118/55, pulse 70, temperature 98.3 F (36.8 C), temperature source Oral, resp. rate 14, height 5\' 4"  (1.626 m), weight 71.2 kg, SpO2 95 %. Body mass index is 26.94 kg/m. General: Well developed, well nourished, in no acute distress. Head: Normocephalic, atraumatic, sclera non-icteric, no xanthomas, nares are without discharge. Neck: No apparent masses Lungs: Normal respirations with no wheezes, no rhonchi, no rales , no crackles   Heart: Regular rate and rhythm, normal S1 S2, no murmur, no rub, no gallop, PMI is normal size and placement, carotid upstroke normal without bruit, jugular venous pressure normal Abdomen: Soft, non-tender, non-distended with normoactive bowel sounds. No hepatosplenomegaly. Abdominal aorta is normal size without bruit Extremities: No edema, no clubbing, no  cyanosis, no ulcers,  Peripheral: 2+ radial, 2+ femoral, 2+ dorsal pedal pulses Neuro: Alert and oriented. Moves all extremities spontaneously. Psych:  Responds to questions appropriately with a normal affect.   Intake/Output Summary (Last 24 hours) at 04/08/2018 9678 Last data filed at 04/08/2018 0559 Gross per 24 hour  Intake 773.6 ml  Output 1725 ml  Net -951.4 ml    Inpatient Medications:  . aspirin  324 mg Oral NOW   Or  . aspirin  300 mg Rectal NOW  . aspirin EC  81 mg Oral Daily  . atorvastatin  40 mg Oral q1800  . levothyroxine  75 mcg Oral QAC breakfast  . sodium chloride flush  3 mL Intravenous Q12H  . ticagrelor  90 mg Oral BID   Infusions:  . sodium chloride      Labs: Recent Labs    04/07/18 0854 04/08/18 0311  NA 139 141  K 3.9 3.6  CL 105 108  CO2 25 26  GLUCOSE 231* 114*  BUN 19 16  CREATININE 0.92 0.76  CALCIUM 9.2 8.6*   No results for input(s): AST, ALT, ALKPHOS, BILITOT, PROT, ALBUMIN in the last 72 hours. Recent Labs    04/07/18 0854 04/08/18 0311  WBC 7.7 8.4  NEUTROABS  --  5.5  HGB 14.4 12.8  HCT 43.7 39.1  MCV 94.8 94.0  PLT 246 225   Recent Labs    04/07/18 0854 04/07/18 2255  TROPONINI 0.19* 0.33*   Invalid input(s): POCBNP Recent Labs    04/07/18 0854  HGBA1C 7.0*     Weights: Filed Weights   04/07/18 1056 04/07/18 1301 04/07/18 1854  Weight: 70.4 kg 70.3 kg 71.2 kg     Radiology/Studies:  Dg Chest 2 View  Result Date: 04/07/2018 CLINICAL DATA:  85 year old female with left-sided chest and arm pain. Intermittent back pain for the past 2 weeks.  Nonsmoker. Initial encounter. EXAM: CHEST - 2 VIEW COMPARISON:  None. FINDINGS: No infiltrate, congestive heart failure or pneumothorax. No plain film evidence of pulmonary malignancy. Heart size within normal limits.  Calcified aorta. Limited evaluation upper thoracic spine secondary to overlying shoulders. No osseous abnormality otherwise noted. IMPRESSION: 1. No acute  cardiopulmonary abnormality noted. 2.  Aortic Atherosclerosis (ICD10-I70.0). Electronically Signed   By: Tracey Robles Del M.D.   On: 04/07/2018 09:13     Assessment and Recommendation  85 y.o. female with essential hypertension mixed hyperlipidemia and non-ST elevation myocardial infarction with two-vessel coronary artery disease.  We have discussed all options with the patient for further intervention and treatment of non-ST elevation myocardial infarction.  This includes the possibility of two-vessel coronary to bypass grafting as well as PCI and stent placement of left anterior descending artery and continued treatment medically of collateral flow to LAD to PDA.  The patient wishes to pursue PCI and stent placement left anterior descending artery and not pursue bypass surgery at this time if able.  This is secondary to the patient known not to have had any medication.  I have cardiovascular risk factors prior to admission patient all knows all the risks and benefits of current choice of treatment including the possibility of death stroke heart attack infection bleeding blood clot from a cardiac catheterization and intervention as well as the possibility of further restenosis or myocardial infarction from treatment.  Additionally she may have some anginal symptoms medically managed with additional medications listed below   1.  Successful PCI and stent placed in the left anterior descending artery 2.  Continue medical management of collateral flow to PDA including isosorbide 30 mg and low-dose beta-blocker 3.  Dual antiplatelet therapy 4.  High intensity cholesterol therapy 5.  Cardiac rehabilitation 6.  Okay for discharge to home with follow-up next week  Signed, Serafina Royals M.D. FACC

## 2018-04-08 NOTE — Care Management Note (Signed)
Case Management Note  Patient Details  Name: Tracey Robles MRN: [id number] Date of Birth: 12-22-1931  Subjective/Objective:  Patient admitted with NSTEMI status post cardiac catheterization and stent placement.  Patient is awake and alert sitting on the side of the bed.  Patient is floor care and waiting on a floor bed.  Patient is from home and lives alone.  Patient is independent and requires no assistive devices.  Patient reports that she does all of her own cooking, cleaning, housework, and yard work and she drives.  Patient has 2 sons that live close by [place] and [place].  PCP verified as Dr. Ginette Pitman who she sees twice per year last visit this past October.  Pharmacy is [facility].   No patient needs identified. Doran Clay RN BSN [phone]                  Action/Plan:   Expected Discharge Date:                  Expected Discharge Plan:  Home/Self Care  In-House Referral:     Discharge planning Services  CM Consult  Post Acute Care Choice:    Choice offered to:     DME Arranged:    DME Agency:     HH Arranged:    [place] Agency:     Status of Service:  Completed, signed off  If discussed at [place] of Stay Meetings, dates discussed:    Additional Comments:  Shelbie Hutching, RN 04/08/2018, 11:29 AM

## 2018-04-08 NOTE — Progress Notes (Signed)
Report called to [facility] on 2 A. Patient informed of transfer. Remains in SR

## 2018-04-08 NOTE — Progress Notes (Signed)
Tracey Robles at [place] NAME: Tracey Robles    MR#:  [id number]  DATE OF BIRTH:  01/22/1932  SUBJECTIVE:  No events overnight, plans for transfer to regular nursing floor and discussion with intensivist  REVIEW OF SYSTEMS:  CONSTITUTIONAL: No fever, fatigue or weakness.  EYES: No blurred or double vision.  EARS, NOSE, AND THROAT: No tinnitus or ear pain.  RESPIRATORY: No cough, shortness of breath, wheezing or hemoptysis.  CARDIOVASCULAR: No chest pain, orthopnea, edema.  GASTROINTESTINAL: No nausea, vomiting, diarrhea or abdominal pain.  GENITOURINARY: No dysuria, hematuria.  ENDOCRINE: No polyuria, nocturia,  HEMATOLOGY: No anemia, easy bruising or bleeding SKIN: No rash or lesion. MUSCULOSKELETAL: No joint pain or arthritis.   NEUROLOGIC: No tingling, numbness, weakness.  PSYCHIATRY: No anxiety or depression.   ROS  DRUG ALLERGIES:   Allergies  Allergen Reactions  . Iodine     VITALS:  Blood pressure (!) 114/54, pulse 79, temperature (!) 97.5 F (36.4 C), resp. rate 16, height 5\' 4"  (1.626 m), weight 71.2 kg, SpO2 94 %.  PHYSICAL EXAMINATION:  GENERAL:  85 y.o.-year-old patient lying in the bed with no acute distress.  EYES: Pupils equal, round, reactive to light and accommodation. No scleral icterus. Extraocular muscles intact.  HEENT: Head atraumatic, normocephalic. Oropharynx and nasopharynx clear.  NECK:  Supple, no jugular venous distention. No thyroid enlargement, no tenderness.  LUNGS: Normal breath sounds bilaterally, no wheezing, rales,rhonchi or crepitation. No use of accessory muscles of respiration.  CARDIOVASCULAR: S1, S2 normal. No murmurs, rubs, or gallops.  ABDOMEN: Soft, nontender, nondistended. Bowel sounds present. No organomegaly or mass.  EXTREMITIES: No pedal edema, cyanosis, or clubbing.  NEUROLOGIC: Cranial nerves II through XII are intact. Muscle strength 5/5 in all extremities. Sensation intact. Gait not  checked.  PSYCHIATRIC: The patient is alert and oriented x 3.  SKIN: No obvious rash, lesion, or ulcer.   Physical Exam LABORATORY PANEL:   CBC Recent Labs  Lab 04/08/18 0311  WBC 8.4  HGB 12.8  HCT 39.1  PLT 225   ------------------------------------------------------------------------------------------------------------------  Chemistries  Recent Labs  Lab 04/08/18 0311  NA 141  K 3.6  CL 108  CO2 26  GLUCOSE 114*  BUN 16  CREATININE 0.76  CALCIUM 8.6*   ------------------------------------------------------------------------------------------------------------------  Cardiac Enzymes Recent Labs  Lab 04/07/18 0854 04/07/18 2255  TROPONINI 0.19* 0.33*   ------------------------------------------------------------------------------------------------------------------  RADIOLOGY:  Dg Chest 2 View  Result Date: 04/07/2018 CLINICAL DATA:  85 year old female with left-sided chest and arm pain. Intermittent back pain for the past 2 weeks. Nonsmoker. Initial encounter. EXAM: CHEST - 2 VIEW COMPARISON:  None. FINDINGS: No infiltrate, congestive heart failure or pneumothorax. No plain film evidence of pulmonary malignancy. Heart size within normal limits.  Calcified aorta. Limited evaluation upper thoracic spine secondary to overlying shoulders. No osseous abnormality otherwise noted. IMPRESSION: 1. No acute cardiopulmonary abnormality noted. 2.  Aortic Atherosclerosis (ICD10-I70.0). Electronically Signed   By: Genia Del M.D.   On: 04/07/2018 09:13    ASSESSMENT AND PLAN:  Patient is 85 year old with history of hypothyroidism and glucose intolerance Resenting with chest pain  *acute NSTEMI Stable on-ST MI Status post LAD stenting  Continue Imdur, beta-blocker therapy,  DAPT with aspirin/Plavix, statin therapy, for cardiac rehab, okay to transfer to regular nursing floor per intensivist   *Chronic Hypothyroidism  Stable continue Synthroid  *Acute  Hyperglycemia hemoglobin A1c 7 Continue sliding scale insulin with ADA/heart healthy diet   Lovenox for DVT prophylaxis  All the records are reviewed and case discussed with Care Management/Social Workerr. Management plans discussed with the patient, family and they are in agreement.  CODE STATUS: full  TOTAL TIME TAKING CARE OF THIS PATIENT: 35 minutes.     POSSIBLE D/C IN 1 DAYS, DEPENDING ON CLINICAL CONDITION.   Avel Peace Salary M.D on 04/08/2018   Between 7am to 6pm - Pager - [phone]  After 6pm go to [email or web address] - password [email or web address]  Office  [phone]  CC: Primary care physician; Tracey Harrier, MD  Note: This dictation was prepared with Dragon dictation along with smaller phrase technology. Any transcriptional errors that result from this process are unintentional.

## 2018-04-08 NOTE — Progress Notes (Signed)
[facility] Encounter Note  Patient: Sieara Bremer ELFYBO / Admit Date: 04/07/2018 / Date of Encounter: 04/08/2018, 8:37 AM   Subjective: Patient has full resolution of chest discomfort after admission to the hospital.  Peak Troponin at 0.19 consistent with non-STEMI Cardiac catheterization showing normal LV systolic function with ejection fraction of 60% Previously occluded right coronary artery with good collaterals from left anterior descending artery to PDA Significant new stenosis of left anterior descending artery of 90% Minimal atherosclerosis of left circumflex artery of 35% Some cp and sob this am multifactorial in nature and no new ecg changes Review of Systems: Positive for: None Negative for: Vision change, hearing change, syncope, dizziness, nausea, vomiting,diarrhea, bloody stool, stomach pain, cough, congestion, diaphoresis, urinary frequency, urinary pain,skin lesions, skin rashes Others previously listed  Objective: Telemetry: Normal sinus rhythm Physical Exam: Blood pressure (!) 118/55, pulse 70, temperature 98.3 F (36.8 C), temperature source Oral, resp. rate 14, height 5\' 4"  (1.626 m), weight 71.2 kg, SpO2 95 %. Body mass index is 26.94 kg/m. General: Well developed, well nourished, in no acute distress. Head: Normocephalic, atraumatic, sclera non-icteric, no xanthomas, nares are without discharge. Neck: No apparent masses Lungs: Normal respirations with no wheezes, no rhonchi, no rales , no crackles   Heart: Regular rate and rhythm, normal S1 S2, no murmur, no rub, no gallop, PMI is normal size and placement, carotid upstroke normal without bruit, jugular venous pressure normal Abdomen: Soft, non-tender, non-distended with normoactive bowel sounds. No hepatosplenomegaly. Abdominal aorta is normal size without bruit Extremities: No edema, no clubbing, no cyanosis, no ulcers,  Peripheral: 2+ radial, 2+ femoral, 2+ dorsal pedal pulses Neuro: Alert  and oriented. Moves all extremities spontaneously. Psych:  Responds to questions appropriately with a normal affect.   Intake/Output Summary (Last 24 hours) at 04/08/2018 0837 Last data filed at 04/08/2018 0559 Gross per 24 hour  Intake 773.6 ml  Output 1725 ml  Net -951.4 ml    Inpatient Medications:  . aspirin  324 mg Oral NOW   Or  . aspirin  300 mg Rectal NOW  . aspirin EC  81 mg Oral Daily  . atorvastatin  40 mg Oral q1800  . isosorbide mononitrate  30 mg Oral Daily  . levothyroxine  75 mcg Oral QAC breakfast  . metoprolol succinate  25 mg Oral Daily  . sodium chloride flush  3 mL Intravenous Q12H  . ticagrelor  90 mg Oral BID   Infusions:  . sodium chloride      Labs: Recent Labs    04/07/18 0854 04/08/18 0311  NA 139 141  K 3.9 3.6  CL 105 108  CO2 25 26  GLUCOSE 231* 114*  BUN 19 16  CREATININE 0.92 0.76  CALCIUM 9.2 8.6*   No results for input(s): AST, ALT, ALKPHOS, BILITOT, PROT, ALBUMIN in the last 72 hours. Recent Labs    04/07/18 0854 04/08/18 0311  WBC 7.7 8.4  NEUTROABS  --  5.5  HGB 14.4 12.8  HCT 43.7 39.1  MCV 94.8 94.0  PLT 246 225   Recent Labs    04/07/18 0854 04/07/18 2255  TROPONINI 0.19* 0.33*   Invalid input(s): POCBNP Recent Labs    04/07/18 0854  HGBA1C 7.0*     Weights: Filed Weights   04/07/18 1056 04/07/18 1301 04/07/18 1854  Weight: 70.4 kg 70.3 kg 71.2 kg     Radiology/Studies:  Dg Chest 2 View  Result Date: 04/07/2018 CLINICAL DATA:  85 year old female with left-sided  chest and arm pain. Intermittent back pain for the past 2 weeks. Nonsmoker. Initial encounter. EXAM: CHEST - 2 VIEW COMPARISON:  None. FINDINGS: No infiltrate, congestive heart failure or pneumothorax. No plain film evidence of pulmonary malignancy. Heart size within normal limits.  Calcified aorta. Limited evaluation upper thoracic spine secondary to overlying shoulders. No osseous abnormality otherwise noted. IMPRESSION: 1. No acute  cardiopulmonary abnormality noted. 2.  Aortic Atherosclerosis (ICD10-I70.0). Electronically Signed   By: Genia Del M.D.   On: 04/07/2018 09:13     Assessment and Recommendation  85 y.o. female with essential hypertension mixed hyperlipidemia and non-ST elevation myocardial infarction with two-vessel coronary artery disease.  We have discussed all options with the patient for further intervention and treatment of non-ST elevation myocardial infarction.  This includes the possibility of two-vessel coronary to bypass grafting as well as PCI and stent placement of left anterior descending artery and continued treatment medically of collateral flow to LAD to PDA.  The patient wishes to pursue PCI and stent placement left anterior descending artery and not pursue bypass surgery at this time if able.  This is secondary to the patient known not to have had any medication.  I have cardiovascular risk factors prior to admission patient all knows all the risks and benefits of current choice of treatment including the possibility of death stroke heart attack infection bleeding blood clot from a cardiac catheterization and intervention as well as the possibility of further restenosis or myocardial infarction from treatment.  Additionally she may have some anginal symptoms medically managed with additional medications listed below   1.  Successful PCI and stent placed in the left anterior descending artery 2.  Continue medical management of collateral flow to PDA with addition of imdur and b-blcoker 3.  Dual antiplatelet therapy but change to plavix due to some sob possibly coming from bilinta 4.  High intensity cholesterol therapy 5.  Cardiac rehabilitation 6. Possible keep today for assessment of medical mgt  Signed, Serafina Royals M.D. FACC

## 2018-04-08 NOTE — Plan of Care (Signed)
Nutrition Education Note  RD consulted for nutrition education regarding a Heart Healthy/Diabetic diet.   RD provided "Heart Healthy Nutrition Therapy" handout from the Academy of Nutrition and Dietetics. Reviewed patient's dietary recall. Provided examples on ways to decrease sodium and fat intake in diet. Discouraged intake of processed foods and use of salt shaker. Encouraged fresh fruits and vegetables as well as whole grain sources of carbohydrates to maximize fiber intake.   RD provided "Nutirtion Therapy with Type II Diabetes" handout from the Academy of Nutrition and Dietetics. Discussed different food groups and their effects on blood sugar, emphasizing carbohydrate-containing foods. Provided list of carbohydrates and recommended serving sizes of common foods.  Discussed importance of controlled and consistent carbohydrate intake throughout the day. Provided examples of ways to balance meals/snacks and encouraged intake of high-fiber, whole grain complex carbohydrates.   Teach back method used.  Expect good compliance.  Body mass index is 26.94 kg/m.  Current diet order is HH, patient is consuming approximately 100% of meals at this time. Labs and medications reviewed. No further nutrition interventions warranted at this time. RD contact information provided. If additional nutrition issues arise, please re-consult RD.  Koleen Distance MS, RD, LDN Pager #- [phone] Office#- [phone] After Hours Pager: [phone]

## 2018-04-09 ENCOUNTER — Other Ambulatory Visit: Payer: Self-pay

## 2018-04-09 MED ORDER — NITROGLYCERIN 0.4 MG SL SUBL: 0.4000 mg | SUBLINGUAL_TABLET | SUBLINGUAL | 0 refills | Status: AC | PRN

## 2018-04-09 MED ORDER — CLOPIDOGREL BISULFATE 75 MG PO TABS: 75.0000 mg | ORAL_TABLET | Freq: Every day | ORAL | 0 refills | Status: DC

## 2018-04-09 MED ORDER — ISOSORBIDE MONONITRATE ER 30 MG PO TB24: 30.0000 mg | ORAL_TABLET | Freq: Every day | ORAL | 0 refills | Status: DC

## 2018-04-09 MED ORDER — ASPIRIN 81 MG PO TBEC: 81.0000 mg | DELAYED_RELEASE_TABLET | Freq: Every day | ORAL | 0 refills | Status: AC

## 2018-04-09 MED ORDER — ATORVASTATIN CALCIUM 40 MG PO TABS: 40.0000 mg | ORAL_TABLET | Freq: Every day | ORAL | 0 refills | Status: AC

## 2018-04-09 MED ORDER — METOPROLOL SUCCINATE ER 25 MG PO TB24: 25.0000 mg | ORAL_TABLET | Freq: Every day | ORAL | 0 refills | Status: DC

## 2018-04-09 NOTE — Progress Notes (Signed)
Tracey Robles to be D/C'd Home per MD order.  Discussed prescriptions and follow up appointments with the patient. Prescriptions given to patient, medication list explained in detail. Pt verbalized understanding.  Allergies as of 04/09/2018      Reactions   Iodine       Medication List    TAKE these medications   aspirin 81 MG EC tablet Take 1 tablet (81 mg total) by mouth daily.   atorvastatin 40 MG tablet Commonly known as:  LIPITOR Take 1 tablet (40 mg total) by mouth daily at 6 PM.   clobetasol cream 0.05 % Commonly known as:  TEMOVATE Apply 1 application topically 2 (two) times daily.   clopidogrel 75 MG tablet Commonly known as:  PLAVIX Take 1 tablet (75 mg total) by mouth daily.   fluconazole 200 MG tablet Commonly known as:  DIFLUCAN Take 200 mg by mouth daily.   isosorbide mononitrate 30 MG 24 hr tablet Commonly known as:  IMDUR Take 1 tablet (30 mg total) by mouth daily.   levothyroxine 75 MCG tablet Commonly known as:  SYNTHROID, LEVOTHROID Take 75 mcg by mouth daily before breakfast.   metoprolol succinate 25 MG 24 hr tablet Commonly known as:  TOPROL-XL Take 1 tablet (25 mg total) by mouth daily.   nitroGLYCERIN 0.4 MG SL tablet Commonly known as:  NITROSTAT Place 1 tablet (0.4 mg total) under the tongue every 5 (five) minutes x 3 doses as needed for chest pain.       Vitals:   04/09/18 0509 04/09/18 0739  BP: (!) 99/59 112/63  Pulse: 74 68  Resp:  19  Temp: 98.7 F (37.1 C) 97.9 F (36.6 C)  SpO2: 97% 96%    Tele box removed and returned. Skin clean, dry and intact without evidence of skin break down, no evidence of skin tears noted. IV catheter discontinued intact. Site without signs and symptoms of complications. Dressing and pressure applied. Pt denies pain at this time. No complaints noted.  An After Visit Summary was printed and given to the patient. Patient escorted via [place], and D/C home via private auto.  Rolley Sims

## 2018-04-09 NOTE — Discharge Summary (Signed)
[facility] [place] NAME: Tracey Robles    MR#:  [id number]  DATE OF BIRTH:  1931-05-24  DATE OF ADMISSION:  04/07/2018 ADMITTING PHYSICIAN: Dustin Flock, MD  DATE OF DISCHARGE: No discharge date for patient encounter.  PRIMARY CARE PHYSICIAN: Tracie Harrier, MD    ADMISSION DIAGNOSIS:  Elevated troponin [R79.89] Chest pain, unspecified type [R07.9]  DISCHARGE DIAGNOSIS:  Active Problems:   Non-ST elevation MI (NSTEMI) ([place])   SECONDARY DIAGNOSIS:   Past Medical History:  Diagnosis Date  . Elevated blood sugar   . Emotional stress   . Hypothyroidism   . Keratosis   . Stenosing tenosynovitis of finger   . Urge incontinence of urine     HOSPITAL COURSE:   Patient is 85 year old with history of hypothyroidism and glucose intolerance Resenting with chest pain  *acute NSTEMI Stable S/p LAD stenting  Treated with Imdur, beta-blocker therapy, DAPT with aspirin/Plavix, statin therapy, for cardiac rehab, cleared for discharge by cardiology with follow-up in 1 week for reevaluation    *Chronic Hypothyroidism  Stable continued Synthroid  *Acute Hyperglycemia hemoglobin A1c 7 Controlled on current regiment    DISCHARGE CONDITIONS:   stable  CONSULTS OBTAINED:  Treatment Team:  Corey Skains, MD  DRUG ALLERGIES:   Allergies  Allergen Reactions  . Iodine     DISCHARGE MEDICATIONS:   Allergies as of 04/09/2018      Reactions   Iodine       Medication List    TAKE these medications   aspirin 81 MG EC tablet Take 1 tablet (81 mg total) by mouth daily.   atorvastatin 40 MG tablet Commonly known as:  LIPITOR Take 1 tablet (40 mg total) by mouth daily at 6 PM.   clobetasol cream 0.05 % Commonly known as:  TEMOVATE Apply 1 application topically 2 (two) times daily.   clopidogrel 75 MG tablet Commonly known as:  PLAVIX Take 1 tablet (75 mg total) by mouth daily.   fluconazole 200 MG  tablet Commonly known as:  DIFLUCAN Take 200 mg by mouth daily.   isosorbide mononitrate 30 MG 24 hr tablet Commonly known as:  IMDUR Take 1 tablet (30 mg total) by mouth daily.   levothyroxine 75 MCG tablet Commonly known as:  SYNTHROID, LEVOTHROID Take 75 mcg by mouth daily before breakfast.   metoprolol succinate 25 MG 24 hr tablet Commonly known as:  TOPROL-XL Take 1 tablet (25 mg total) by mouth daily.   nitroGLYCERIN 0.4 MG SL tablet Commonly known as:  NITROSTAT Place 1 tablet (0.4 mg total) under the tongue every 5 (five) minutes x 3 doses as needed for chest pain.        DISCHARGE INSTRUCTIONS:     If you experience worsening of your admission symptoms, develop shortness of breath, life threatening emergency, suicidal or homicidal thoughts you must seek medical attention immediately by calling 911 or calling your MD immediately  if symptoms less severe.  You Must read complete instructions/literature along with all the possible adverse reactions/side effects for all the Medicines you take and that have been prescribed to you. Take any new Medicines after you have completely understood and accept all the possible adverse reactions/side effects.   Please note  You were cared for by a hospitalist during your hospital stay. If you have any questions about your discharge medications or the care you received while you were in the hospital after you are discharged, you can call the  unit and asked to speak with the hospitalist on call if the hospitalist that took care of you is not available. Once you are discharged, your primary care physician will handle any further medical issues. Please note that NO REFILLS for any discharge medications will be authorized once you are discharged, as it is imperative that you return to your primary care physician (or establish a relationship with a primary care physician if you do not have one) for your aftercare needs so that they can reassess  your need for medications and monitor your lab values.    Today   CHIEF COMPLAINT:   Chief Complaint  Patient presents with  . Chest Pain    HISTORY OF PRESENT ILLNESS:  85 y.o. female with a known history of glucose intolerance, hypothyroidism who is presenting to the emergency room with chest pain ongoing for the past 2 weeks.  Patient states that she went to a cruise recently and since then she has been having this left-sided chest pain with activity as well as rest.  This morning the pain was the worst it woke her up.  Now the pain is resolved.  She also had pain radiating to her left arm.  EKG is negative however troponin is elevated.  Patient denies any previous cardiac history.   VITAL SIGNS:  Blood pressure 112/63, pulse 68, temperature 97.9 F (36.6 C), temperature source Oral, resp. rate 19, height 5\' 4"  (1.626 m), weight 70.2 kg, SpO2 96 %.  I/O:    Intake/Output Summary (Last 24 hours) at 04/09/2018 1025 Last data filed at 04/09/2018 0954 Gross per 24 hour  Intake 377.13 ml  Output 1650 ml  Net -1272.87 ml    PHYSICAL EXAMINATION:  GENERAL:  85 y.o.-year-old patient lying in the bed with no acute distress.  EYES: Pupils equal, round, reactive to light and accommodation. No scleral icterus. Extraocular muscles intact.  HEENT: Head atraumatic, normocephalic. Oropharynx and nasopharynx clear.  NECK:  Supple, no jugular venous distention. No thyroid enlargement, no tenderness.  LUNGS: Normal breath sounds bilaterally, no wheezing, rales,rhonchi or crepitation. No use of accessory muscles of respiration.  CARDIOVASCULAR: S1, S2 normal. No murmurs, rubs, or gallops.  ABDOMEN: Soft, non-tender, non-distended. Bowel sounds present. No organomegaly or mass.  EXTREMITIES: No pedal edema, cyanosis, or clubbing.  NEUROLOGIC: Cranial nerves II through XII are intact. Muscle strength 5/5 in all extremities. Sensation intact. Gait not checked.  PSYCHIATRIC: The patient is alert  and oriented x 3.  SKIN: No obvious rash, lesion, or ulcer.   DATA REVIEW:   CBC Recent Labs  Lab 04/08/18 0311  WBC 8.4  HGB 12.8  HCT 39.1  PLT 225    Chemistries  Recent Labs  Lab 04/08/18 0311  NA 141  K 3.6  CL 108  CO2 26  GLUCOSE 114*  BUN 16  CREATININE 0.76  CALCIUM 8.6*    Cardiac Enzymes Recent Labs  Lab 04/07/18 2255  TROPONINI 0.33*    Microbiology Results  Results for orders placed or performed during the hospital encounter of 04/07/18  MRSA PCR Screening     Status: None   Collection Time: 04/07/18  7:09 PM  Result Value Ref Range Status   MRSA by PCR NEGATIVE NEGATIVE Final    Comment:        The GeneXpert MRSA Assay (FDA approved for NASAL specimens only), is one component of a comprehensive MRSA colonization surveillance program. It is not intended to diagnose MRSA infection nor to guide or  monitor treatment for MRSA infections. Performed at [facility], [address] [place], [place] [address]     RADIOLOGY:  No results found.  EKG:   Orders placed or performed during the hospital encounter of 04/07/18  . EKG 12-Lead  . EKG 12-Lead  . ED EKG  . ED EKG  . EKG 12-Lead  . EKG 12-Lead immediately post procedure  . EKG 12-Lead  . EKG 12-Lead  . EKG 12-Lead  . EKG 12-Lead immediately post procedure  . EKG 12-Lead  . EKG 12-Lead  . EKG 12-Lead      Management plans discussed with the patient, family and they are in agreement.  CODE STATUS:     Code Status Orders  (From admission, onward)         Start     Ordered   04/07/18 1130  Full code  Continuous     04/07/18 1129        Code Status History    Date Active Date Inactive Code Status Order ID Comments User Context   04/07/2018 1006 04/07/2018 1129 DNR [id number]  Dustin Flock, MD ED      TOTAL TIME TAKING CARE OF THIS PATIENT: 40 minutes.    Avel Peace Jaiven Graveline M.D on 04/09/2018 at 10:25 AM  Between 7am to 6pm - Pager -  [phone]  After 6pm go to [email or web address] - password [email or web address] [facility]  Office  [phone]  CC: Primary care physician; Tracie Harrier, MD   Note: This dictation was prepared with Dragon dictation along with smaller phrase technology. Any transcriptional errors that result from this process are unintentional.

## 2018-04-09 NOTE — Progress Notes (Signed)
[facility] Encounter Note  Patient: Tracey Robles SNKNLZ / Admit Date: 04/07/2018 / Date of Encounter: 04/09/2018, 8:13 AM   Subjective: Patient has full resolution of chest discomfort after admission to the hospital.  Peak Troponin at 0.19 consistent with non-STEMI Cardiac catheterization showing normal LV systolic function with ejection fraction of 60% Previously occluded right coronary artery with good collaterals from left anterior descending artery to PDA Significant new stenosis of left anterior descending artery of 90% Minimal atherosclerosis of left circumflex artery of 35% Some cp and sob yesterday am multifactorial in nature and no new ecg changes likely secondary to medication management and procedure Review of Systems: Positive for: None Negative for: Vision change, hearing change, syncope, dizziness, nausea, vomiting,diarrhea, bloody stool, stomach pain, cough, congestion, diaphoresis, urinary frequency, urinary pain,skin lesions, skin rashes Others previously listed  Objective: Telemetry: Normal sinus rhythm Physical Exam: Blood pressure 112/63, pulse 68, temperature 97.9 F (36.6 C), temperature source Oral, resp. rate 19, height 5\' 4"  (1.626 m), weight 70.2 kg, SpO2 96 %. Body mass index is 26.57 kg/m. General: Well developed, well nourished, in no acute distress. Head: Normocephalic, atraumatic, sclera non-icteric, no xanthomas, nares are without discharge. Neck: No apparent masses Lungs: Normal respirations with no wheezes, no rhonchi, no rales , no crackles   Heart: Regular rate and rhythm, normal S1 S2, no murmur, no rub, no gallop, PMI is normal size and placement, carotid upstroke normal without bruit, jugular venous pressure normal Abdomen: Soft, non-tender, non-distended with normoactive bowel sounds. No hepatosplenomegaly. Abdominal aorta is normal size without bruit Extremities: No edema, no clubbing, no cyanosis, no ulcers,  Peripheral: 2+  radial, 2+ femoral, 2+ dorsal pedal pulses Neuro: Alert and oriented. Moves all extremities spontaneously. Psych:  Responds to questions appropriately with a normal affect.   Intake/Output Summary (Last 24 hours) at 04/09/2018 0813 Last data filed at 04/09/2018 0509 Gross per 24 hour  Intake 377.13 ml  Output 1650 ml  Net -1272.87 ml    Inpatient Medications:  . aspirin EC  81 mg Oral Daily  . atorvastatin  40 mg Oral q1800  . clopidogrel  75 mg Oral Daily  . isosorbide mononitrate  30 mg Oral Daily  . levothyroxine  75 mcg Oral QAC breakfast  . metoprolol succinate  25 mg Oral Daily  . sodium chloride flush  3 mL Intravenous Q12H   Infusions:  . sodium chloride      Labs: Recent Labs    04/07/18 0854 04/08/18 0311  NA 139 141  K 3.9 3.6  CL 105 108  CO2 25 26  GLUCOSE 231* 114*  BUN 19 16  CREATININE 0.92 0.76  CALCIUM 9.2 8.6*   No results for input(s): AST, ALT, ALKPHOS, BILITOT, PROT, ALBUMIN in the last 72 hours. Recent Labs    04/07/18 0854 04/08/18 0311  WBC 7.7 8.4  NEUTROABS  --  5.5  HGB 14.4 12.8  HCT 43.7 39.1  MCV 94.8 94.0  PLT 246 225   Recent Labs    04/07/18 0854 04/07/18 2255  TROPONINI 0.19* 0.33*   Invalid input(s): POCBNP Recent Labs    04/07/18 0854  HGBA1C 7.0*     Weights: Filed Weights   04/07/18 1301 04/07/18 1854 04/09/18 0509  Weight: 70.3 kg 71.2 kg 70.2 kg     Radiology/Studies:  Dg Chest 2 View  Result Date: 04/07/2018 CLINICAL DATA:  85 year old female with left-sided chest and arm pain. Intermittent back pain for the past 2 weeks. Nonsmoker.  Initial encounter. EXAM: CHEST - 2 VIEW COMPARISON:  None. FINDINGS: No infiltrate, congestive heart failure or pneumothorax. No plain film evidence of pulmonary malignancy. Heart size within normal limits.  Calcified aorta. Limited evaluation upper thoracic spine secondary to overlying shoulders. No osseous abnormality otherwise noted. IMPRESSION: 1. No acute  cardiopulmonary abnormality noted. 2.  Aortic Atherosclerosis (ICD10-I70.0). Electronically Signed   By: Genia Del M.D.   On: 04/07/2018 09:13     Assessment and Recommendation  85 y.o. female with essential hypertension mixed hyperlipidemia and non-ST elevation myocardial infarction with two-vessel coronary artery disease.  We have discussed all options with the patient for further intervention and treatment of non-ST elevation myocardial infarction.  This includes the possibility of two-vessel coronary to bypass grafting as well as PCI and stent placement of left anterior descending artery and continued treatment medically of collateral flow to LAD to PDA.  The patient wishes to pursue PCI and stent placement left anterior descending artery and not pursue bypass surgery at this time if able.  This is secondary to the patient known not to have had any medication.  I have cardiovascular risk factors prior to admission patient all knows all the risks and benefits of current choice of treatment including the possibility of death stroke heart attack infection bleeding blood clot from a cardiac catheterization and intervention as well as the possibility of further restenosis or myocardial infarction from treatment.  Additionally she may have some anginal symptoms medically managed with additional medications listed below   1.  Successful PCI and stent placed in the left anterior descending artery 2.  Continue medical management of collateral flow to PDA with addition of imdur and b-blcoker 3.  Dual antiplatelet therapy but changed to plavix due to some sob possibly coming from bilinta 4.  High intensity cholesterol therapy 5.  Cardiac rehabilitation 6.  Begin ambulation and follow for improvements of symptoms and okay for discharge to home with follow-up in 1 to 2 weeks for further adjustments of medication management  Signed, Serafina Royals M.D. FACC

## 2018-04-14 DIAGNOSIS — I2510 Atherosclerotic heart disease of native coronary artery without angina pectoris: Secondary | ICD-10-CM | POA: Diagnosis not present

## 2018-04-14 DIAGNOSIS — Z09 Encounter for follow-up examination after completed treatment for conditions other than malignant neoplasm: Secondary | ICD-10-CM | POA: Diagnosis not present

## 2018-04-14 DIAGNOSIS — Z955 Presence of coronary angioplasty implant and graft: Secondary | ICD-10-CM | POA: Diagnosis not present

## 2018-04-14 DIAGNOSIS — R739 Hyperglycemia, unspecified: Secondary | ICD-10-CM | POA: Diagnosis not present

## 2018-04-14 DIAGNOSIS — E039 Hypothyroidism, unspecified: Secondary | ICD-10-CM | POA: Diagnosis not present

## 2018-04-24 DIAGNOSIS — Z955 Presence of coronary angioplasty implant and graft: Secondary | ICD-10-CM | POA: Diagnosis not present

## 2018-04-24 DIAGNOSIS — I2510 Atherosclerotic heart disease of native coronary artery without angina pectoris: Secondary | ICD-10-CM | POA: Diagnosis not present

## 2018-05-06 ENCOUNTER — Other Ambulatory Visit: Payer: Self-pay | Admitting: Pharmacist

## 2018-05-06 NOTE — Patient Outreach (Signed)
**Note Tracey-Identified via Obfuscation** Tracey Robles) Care Management  [place]   05/06/2018  Tracey Robles 1932/02/24 [id number]  Reason for referral: Medication Reconciliation Post Discharge  Referral source: [employer]   PMHx includes but not limited to:  Hypothyroidism, NSTEMI 04/07/2018  Subjective: Successful telephone call with patient.  HIPAA identifiers verified. Patient admitted to [facility] 04/07/2018-04/09/2018 for NSTEMI. She notes that she had been experiencing chest pain for a few weeks prior, but did not seek care until she came back from a cruise the week before the was admitted. A DES was placed in LAD.   She notes that it has been a big change going from 1 chronic medication to 8 chronic medications in the past month, but that she believes she is managing well. She endorses adherence, noting that she is about to take all of her medications, as she doesn't take any before going to her exercise class.   She has not been checking blood pressure medications at home, but notes that she did have follow up with cardiology when lisinopril was added, and will have follow up later in March. She has follow up with her PCP in April. She notes that she did take 1 dose of nitroglycerin the other day, but in retrospect, she believes she was having indigestion.  She denies any questions or concerns today.   Objective: Lab Results  Component Value Date   CREATININE 0.76 04/08/2018   CREATININE 0.92 04/07/2018  eGFR >60 mL/min  Lab Results  Component Value Date   HGBA1C 7.0 (H) 04/07/2018    Lipid Panel     Component Value Date/Time   CHOL 182 04/08/2018 0311   TRIG 177 (H) 04/08/2018 0311   HDL 28 (L) 04/08/2018 0311   CHOLHDL 6.5 04/08/2018 0311   VLDL 35 04/08/2018 0311   LDLCALC 119 (H) 04/08/2018 0311    BP Readings from Last 3 Encounters:  04/09/18 112/63  02/15/17 (!) 144/71    Allergies  Allergen Reactions  . Iodine Itching    Medications Reviewed Today    Reviewed by Tracey Robles, Tracey Robles (Pharmacist) on 05/06/18 at [facility] List Status: <None>  Medication Order Taking? Sig Documenting Provider Last Dose Status Informant  aspirin EC 81 MG EC tablet [id number] Yes Take 1 tablet (81 mg total) by mouth daily. Tracey Robles, Tracey Peace, MD Taking Active   atorvastatin (LIPITOR) 40 MG tablet [id number] Yes Take 1 tablet (40 mg total) by mouth daily at 6 PM. Tracey Robles, Tracey Peace, MD Taking Active   clopidogrel (PLAVIX) 75 MG tablet [id number] Yes Take 1 tablet (75 mg total) by mouth daily. Tracey Robles, Tracey Peace, MD Taking Active   isosorbide mononitrate (IMDUR) 30 MG 24 hr tablet [id number] Yes Take 1 tablet (30 mg total) by mouth daily. Tracey Robles, Tracey Peace, MD Taking Active   levothyroxine (SYNTHROID, LEVOTHROID) 75 MCG tablet [id number] Yes Take 75 mcg by mouth daily before breakfast. [provider] Taking Active Self  lisinopril (PRINIVIL,ZESTRIL) 5 MG tablet [id number] Yes Take 5 mg by mouth daily. Tracey Odor, Tracey Robles Taking Active   metoprolol succinate (TOPROL-XL) 25 MG 24 hr tablet [id number] Yes Take 1 tablet (25 mg total) by mouth daily. Tracey Robles, Tracey Peace, MD Taking Active   nitroGLYCERIN (NITROSTAT) 0.4 MG SL tablet [id number] Yes Place 1 tablet (0.4 mg total) under the tongue every 5 (five) minutes x 3 doses as needed for chest pain. Tracey Robles, Tracey Peace, MD Taking Active  Assessment:  Date Discharged from Robles: 04/09/2018 Date Medication Reconciliation Performed: 05/06/2018  Medications:  New at Discharge: . Aspirin . Clopidogrel . Atorvastatin . Isosorbide . Metoprolol . Nitroglycerin . Lisinopril (added at cardiology f/u)  Patient was recently discharged from [facility] and all medications have been reviewed.  Medication Review Findings:  . Consider encouraging patient to check BP at home with recent initiation of metoprolol + lisinopril therapy, and check for orthostatics . Counseled patient on purpose for each of her new  medications.    Plan: - Patient denied any questions or concerns at this time. She has my contact information for any future questions or concerns.  - Will route note to PCP Tracey Robles for notification.   Tracey Robles, PharmD, Tracey Robles PGY2 Ambulatory Care Pharmacy Resident, [facility] Phone: [phone]

## 2018-05-28 DIAGNOSIS — E782 Mixed hyperlipidemia: Secondary | ICD-10-CM | POA: Diagnosis not present

## 2018-05-28 DIAGNOSIS — I2510 Atherosclerotic heart disease of native coronary artery without angina pectoris: Secondary | ICD-10-CM | POA: Diagnosis not present

## 2018-05-28 DIAGNOSIS — I10 Essential (primary) hypertension: Secondary | ICD-10-CM | POA: Diagnosis not present

## 2018-07-01 DIAGNOSIS — R739 Hyperglycemia, unspecified: Secondary | ICD-10-CM | POA: Diagnosis not present

## 2018-07-01 DIAGNOSIS — R8290 Unspecified abnormal findings in urine: Secondary | ICD-10-CM | POA: Diagnosis not present

## 2018-07-01 DIAGNOSIS — E039 Hypothyroidism, unspecified: Secondary | ICD-10-CM | POA: Diagnosis not present

## 2018-07-01 DIAGNOSIS — E786 Lipoprotein deficiency: Secondary | ICD-10-CM | POA: Diagnosis not present

## 2018-07-01 DIAGNOSIS — L570 Actinic keratosis: Secondary | ICD-10-CM | POA: Diagnosis not present

## 2018-07-01 DIAGNOSIS — Z0000 Encounter for general adult medical examination without abnormal findings: Secondary | ICD-10-CM | POA: Diagnosis not present

## 2018-07-08 DIAGNOSIS — Z955 Presence of coronary angioplasty implant and graft: Secondary | ICD-10-CM | POA: Diagnosis not present

## 2018-07-08 DIAGNOSIS — I10 Essential (primary) hypertension: Secondary | ICD-10-CM | POA: Diagnosis not present

## 2018-07-08 DIAGNOSIS — E1165 Type 2 diabetes mellitus with hyperglycemia: Secondary | ICD-10-CM | POA: Diagnosis not present

## 2018-07-08 DIAGNOSIS — Z0000 Encounter for general adult medical examination without abnormal findings: Secondary | ICD-10-CM | POA: Diagnosis not present

## 2018-07-08 DIAGNOSIS — E039 Hypothyroidism, unspecified: Secondary | ICD-10-CM | POA: Diagnosis not present

## 2018-07-08 DIAGNOSIS — I2510 Atherosclerotic heart disease of native coronary artery without angina pectoris: Secondary | ICD-10-CM | POA: Diagnosis not present

## 2018-07-30 DIAGNOSIS — I10 Essential (primary) hypertension: Secondary | ICD-10-CM | POA: Diagnosis not present

## 2018-07-30 DIAGNOSIS — I2510 Atherosclerotic heart disease of native coronary artery without angina pectoris: Secondary | ICD-10-CM | POA: Diagnosis not present

## 2018-07-30 DIAGNOSIS — Z955 Presence of coronary angioplasty implant and graft: Secondary | ICD-10-CM | POA: Diagnosis not present

## 2018-07-30 DIAGNOSIS — E782 Mixed hyperlipidemia: Secondary | ICD-10-CM | POA: Diagnosis not present

## 2018-12-11 ENCOUNTER — Other Ambulatory Visit: Payer: Self-pay | Admitting: Internal Medicine

## 2018-12-11 DIAGNOSIS — Z1231 Encounter for screening mammogram for malignant neoplasm of breast: Secondary | ICD-10-CM

## 2018-12-15 DIAGNOSIS — L578 Other skin changes due to chronic exposure to nonionizing radiation: Secondary | ICD-10-CM | POA: Diagnosis not present

## 2018-12-15 DIAGNOSIS — L720 Epidermal cyst: Secondary | ICD-10-CM | POA: Diagnosis not present

## 2018-12-15 DIAGNOSIS — L900 Lichen sclerosus et atrophicus: Secondary | ICD-10-CM | POA: Diagnosis not present

## 2018-12-15 DIAGNOSIS — Z872 Personal history of diseases of the skin and subcutaneous tissue: Secondary | ICD-10-CM | POA: Diagnosis not present

## 2018-12-15 DIAGNOSIS — Z85828 Personal history of other malignant neoplasm of skin: Secondary | ICD-10-CM | POA: Diagnosis not present

## 2018-12-15 DIAGNOSIS — L729 Follicular cyst of the skin and subcutaneous tissue, unspecified: Secondary | ICD-10-CM | POA: Diagnosis not present

## 2018-12-15 DIAGNOSIS — D487 Neoplasm of uncertain behavior of other specified sites: Secondary | ICD-10-CM | POA: Diagnosis not present

## 2018-12-15 DIAGNOSIS — L570 Actinic keratosis: Secondary | ICD-10-CM | POA: Diagnosis not present

## 2018-12-31 DIAGNOSIS — I10 Essential (primary) hypertension: Secondary | ICD-10-CM | POA: Diagnosis not present

## 2018-12-31 DIAGNOSIS — Z955 Presence of coronary angioplasty implant and graft: Secondary | ICD-10-CM | POA: Diagnosis not present

## 2018-12-31 DIAGNOSIS — I2510 Atherosclerotic heart disease of native coronary artery without angina pectoris: Secondary | ICD-10-CM | POA: Diagnosis not present

## 2018-12-31 DIAGNOSIS — E1165 Type 2 diabetes mellitus with hyperglycemia: Secondary | ICD-10-CM | POA: Diagnosis not present

## 2018-12-31 DIAGNOSIS — E039 Hypothyroidism, unspecified: Secondary | ICD-10-CM | POA: Diagnosis not present

## 2019-01-07 DIAGNOSIS — E782 Mixed hyperlipidemia: Secondary | ICD-10-CM | POA: Diagnosis not present

## 2019-01-07 DIAGNOSIS — E786 Lipoprotein deficiency: Secondary | ICD-10-CM | POA: Diagnosis not present

## 2019-01-07 DIAGNOSIS — Z955 Presence of coronary angioplasty implant and graft: Secondary | ICD-10-CM | POA: Diagnosis not present

## 2019-01-07 DIAGNOSIS — E1165 Type 2 diabetes mellitus with hyperglycemia: Secondary | ICD-10-CM | POA: Diagnosis not present

## 2019-01-07 DIAGNOSIS — Z23 Encounter for immunization: Secondary | ICD-10-CM | POA: Diagnosis not present

## 2019-01-07 DIAGNOSIS — E039 Hypothyroidism, unspecified: Secondary | ICD-10-CM | POA: Diagnosis not present

## 2019-01-07 DIAGNOSIS — N3941 Urge incontinence: Secondary | ICD-10-CM | POA: Diagnosis not present

## 2019-01-07 DIAGNOSIS — I2510 Atherosclerotic heart disease of native coronary artery without angina pectoris: Secondary | ICD-10-CM | POA: Diagnosis not present

## 2019-01-07 DIAGNOSIS — Z780 Asymptomatic menopausal state: Secondary | ICD-10-CM | POA: Diagnosis not present

## 2019-01-09 DIAGNOSIS — M8588 Other specified disorders of bone density and structure, other site: Secondary | ICD-10-CM | POA: Diagnosis not present

## 2019-01-14 DIAGNOSIS — Z961 Presence of intraocular lens: Secondary | ICD-10-CM | POA: Diagnosis not present

## 2019-01-15 ENCOUNTER — Ambulatory Visit
Admission: RE | Admit: 2019-01-15 | Discharge: 2019-01-15 | Disposition: A | Discharge status: Home / Self Care | Payer: PPO | Source: Ambulatory Visit | Attending: Internal Medicine | Admitting: Internal Medicine

## 2019-01-15 DIAGNOSIS — Z1231 Encounter for screening mammogram for malignant neoplasm of breast: Secondary | ICD-10-CM

## 2019-01-29 DIAGNOSIS — I2510 Atherosclerotic heart disease of native coronary artery without angina pectoris: Secondary | ICD-10-CM | POA: Diagnosis not present

## 2019-01-29 DIAGNOSIS — E782 Mixed hyperlipidemia: Secondary | ICD-10-CM | POA: Diagnosis not present

## 2019-01-29 DIAGNOSIS — L720 Epidermal cyst: Secondary | ICD-10-CM | POA: Diagnosis not present

## 2019-01-29 DIAGNOSIS — I25118 Atherosclerotic heart disease of native coronary artery with other forms of angina pectoris: Secondary | ICD-10-CM | POA: Diagnosis not present

## 2019-01-29 DIAGNOSIS — I10 Essential (primary) hypertension: Secondary | ICD-10-CM | POA: Diagnosis not present

## 2019-01-29 DIAGNOSIS — L900 Lichen sclerosus et atrophicus: Secondary | ICD-10-CM | POA: Diagnosis not present

## 2019-02-23 DIAGNOSIS — I25118 Atherosclerotic heart disease of native coronary artery with other forms of angina pectoris: Secondary | ICD-10-CM | POA: Diagnosis not present

## 2019-03-02 DIAGNOSIS — I10 Essential (primary) hypertension: Secondary | ICD-10-CM | POA: Diagnosis not present

## 2019-03-02 DIAGNOSIS — I25118 Atherosclerotic heart disease of native coronary artery with other forms of angina pectoris: Secondary | ICD-10-CM | POA: Diagnosis not present

## 2019-03-02 DIAGNOSIS — E782 Mixed hyperlipidemia: Secondary | ICD-10-CM | POA: Diagnosis not present

## 2019-03-23 DIAGNOSIS — M545 Low back pain: Secondary | ICD-10-CM | POA: Diagnosis not present

## 2019-04-27 ENCOUNTER — Encounter: Payer: Self-pay | Admitting: Emergency Medicine

## 2019-04-27 ENCOUNTER — Emergency Department
Admission: EM | Admit: 2019-04-27 | Discharge: 2019-04-27 | Disposition: A | Discharge status: Home / Self Care | Payer: PPO | Attending: Emergency Medicine | Admitting: Emergency Medicine

## 2019-04-27 ENCOUNTER — Other Ambulatory Visit: Payer: Self-pay

## 2019-04-27 ENCOUNTER — Emergency Department: Payer: PPO

## 2019-04-27 DIAGNOSIS — Z7902 Long term (current) use of antithrombotics/antiplatelets: Secondary | ICD-10-CM | POA: Diagnosis not present

## 2019-04-27 DIAGNOSIS — I252 Old myocardial infarction: Secondary | ICD-10-CM | POA: Diagnosis not present

## 2019-04-27 DIAGNOSIS — K529 Noninfective gastroenteritis and colitis, unspecified: Secondary | ICD-10-CM | POA: Diagnosis not present

## 2019-04-27 DIAGNOSIS — R112 Nausea with vomiting, unspecified: Secondary | ICD-10-CM | POA: Diagnosis present

## 2019-04-27 DIAGNOSIS — I2510 Atherosclerotic heart disease of native coronary artery without angina pectoris: Secondary | ICD-10-CM | POA: Diagnosis not present

## 2019-04-27 DIAGNOSIS — Z79899 Other long term (current) drug therapy: Secondary | ICD-10-CM | POA: Insufficient documentation

## 2019-04-27 DIAGNOSIS — E860 Dehydration: Secondary | ICD-10-CM | POA: Insufficient documentation

## 2019-04-27 DIAGNOSIS — K8020 Calculus of gallbladder without cholecystitis without obstruction: Secondary | ICD-10-CM | POA: Diagnosis not present

## 2019-04-27 HISTORY — DX: Atherosclerotic heart disease of native coronary artery without angina pectoris: I25.10

## 2019-04-27 LAB — HEMOGLOBIN AND HEMATOCRIT, BLOOD
HCT: 40.1 % (ref 36.0–46.0)
Hemoglobin: 13.4 g/dL (ref 12.0–15.0)

## 2019-04-27 LAB — COMPREHENSIVE METABOLIC PANEL
ALT: 27 U/L (ref 0–44)
AST: 33 U/L (ref 15–41)
Albumin: 4.1 g/dL (ref 3.5–5.0)
Alkaline Phosphatase: 59 U/L (ref 38–126)
Anion gap: 12 (ref 5–15)
BUN: 23 mg/dL (ref 8–23)
CO2: 26 mmol/L (ref 22–32)
Calcium: 8.9 mg/dL (ref 8.9–10.3)
Chloride: 99 mmol/L (ref 98–111)
Creatinine, Ser: 0.84 mg/dL (ref 0.44–1.00)
GFR calc Af Amer: >60 mL/min (ref >60)
GFR calc non Af Amer: >60 mL/min (ref >60)
Glucose, Bld: 205 mg/dL — ABNORMAL HIGH (ref 70–99)
Potassium: 3.9 mmol/L (ref 3.5–5.1)
Sodium: 137 mmol/L (ref 135–145)
Total Bilirubin: 0.9 mg/dL (ref 0.3–1.2)
Total Protein: 7.3 g/dL (ref 6.5–8.1)

## 2019-04-27 LAB — CBC
HCT: 42.3 % (ref 36.0–46.0)
Hemoglobin: 14.2 g/dL (ref 12.0–15.0)
MCH: 31.5 pg (ref 26.0–34.0)
MCHC: 33.6 g/dL (ref 30.0–36.0)
MCV: 93.8 fL (ref 80.0–100.0)
Platelets: 264 10*3/uL (ref 150–400)
RBC: 4.51 MIL/uL (ref 3.87–5.11)
RDW: 12.6 % (ref 11.5–15.5)
WBC: 16.8 10*3/uL — ABNORMAL HIGH (ref 4.0–10.5)
nRBC: 0 % (ref 0.0–0.2)

## 2019-04-27 LAB — TYPE AND SCREEN
ABO/RH(D): A POS
Antibody Screen: NEGATIVE

## 2019-04-27 MED ORDER — ONDANSETRON HCL 4 MG PO TABS: 4.0000 mg | ORAL_TABLET | Freq: Three times a day (TID) | ORAL | 0 refills | Status: DC | PRN

## 2019-04-27 MED ORDER — AMOXICILLIN-POT CLAVULANATE ER 1000-62.5 MG PO TB12: 1.0000 | ORAL_TABLET | Freq: Two times a day (BID) | ORAL | 0 refills | Status: AC

## 2019-04-27 MED ORDER — SODIUM CHLORIDE 0.9 % IV BOLUS
1000.0000 mL | Freq: Once | INTRAVENOUS | Status: AC
  Administered 2019-04-27: 09:00:00 1000 mL via INTRAVENOUS

## 2019-04-27 MED ORDER — ONDANSETRON HCL 4 MG/2ML IJ SOLN
4.0000 mg | Freq: Once | INTRAMUSCULAR | Status: AC
  Administered 2019-04-27: 4 mg via INTRAVENOUS
  Filled 2019-04-27: qty 2

## 2019-04-27 NOTE — Discharge Instructions (Signed)
Please seek medical attention for any high fevers, chest pain, shortness of breath, change in behavior, persistent vomiting, bloody stool or any other new or concerning symptoms.  

## 2019-04-27 NOTE — ED Notes (Signed)
Pt assisted to bedside toilet at this time. Pt noted steady on feet and is now safely back in bed. Pt denies needs at this time, will continue to monitor.

## 2019-04-27 NOTE — ED Provider Notes (Signed)
[facility] Emergency Department Provider Note  ____________________________________________   I have reviewed the triage vital signs and the nursing notes.   HISTORY  Chief Complaint Vomiting and diarrhea  History limited by: Not Limited   HPI Tracey Robles is a 86 y.o. female who presents to the emergency department today because of concerns for nausea vomiting and diarrhea.  Patient states that the symptoms started last night.  They started shortly after eating some soup.  She would have multiple episodes of vomiting and diarrhea.  They were occurring roughly every hour.  She did notice some red blood streaked in her diarrhea.  Patient denies any significant abdominal pain.  She says that she used to have issues with her stomach although this has not happened for quite some time.  Patient did feel hot and then had some chills.   Records reviewed. Per medical record review patient has a history of diverticulum and internal hemorrhoids.   Past Medical History:  Diagnosis Date  . Coronary artery disease   . Elevated blood sugar   . Emotional stress   . Hypothyroidism   . Keratosis   . Stenosing tenosynovitis of finger   . Urge incontinence of urine     Patient Active Problem List   Diagnosis Date Noted  . Non-ST elevation MI (NSTEMI) ([place]) 04/07/2018    Past Surgical History:  Procedure Laterality Date  . ABDOMINAL HYSTERECTOMY    . Bladder Tact Surgery    . CATARACT EXTRACTION    . COLONOSCOPY    . COLONOSCOPY WITH PROPOFOL N/A 02/15/2017   Procedure: COLONOSCOPY WITH PROPOFOL;  Surgeon: Lollie Sails, MD;  Location: [facility] ENDOSCOPY;  Service: Endoscopy;  Laterality: N/A;  . CORONARY STENT INTERVENTION N/A 04/07/2018   Procedure: CORONARY STENT INTERVENTION;  Surgeon: Yolonda Kida, MD;  Location: [place] CV LAB;  Service: Cardiovascular;  Laterality: N/A;  LAD  . LEFT HEART CATH AND CORONARY ANGIOGRAPHY N/A 04/07/2018    Procedure: LEFT HEART CATH AND CORONARY ANGIOGRAPHY;  Surgeon: Corey Skains, MD;  Location: [place] CV LAB;  Service: Cardiovascular;  Laterality: N/A;  . TUBAL LIGATION      Prior to Admission medications   Medication Sig Start Date End Date Taking? Authorizing Provider  aspirin EC 81 MG EC tablet Take 1 tablet (81 mg total) by mouth daily. 04/09/18   Salary, Avel Peace, MD  atorvastatin (LIPITOR) 40 MG tablet Take 1 tablet (40 mg total) by mouth daily at 6 PM. 04/09/18   Salary, Avel Peace, MD  clopidogrel (PLAVIX) 75 MG tablet Take 1 tablet (75 mg total) by mouth daily. 04/09/18   Salary, Avel Peace, MD  isosorbide mononitrate (IMDUR) 30 MG 24 hr tablet Take 1 tablet (30 mg total) by mouth daily. 04/09/18   Salary, Avel Peace, MD  levothyroxine (SYNTHROID, LEVOTHROID) 75 MCG tablet Take 75 mcg by mouth daily before breakfast.    [provider]  lisinopril (PRINIVIL,ZESTRIL) 5 MG tablet Take 5 mg by mouth daily.    Hilbert Odor, PA-C  metoprolol succinate (TOPROL-XL) 25 MG 24 hr tablet Take 1 tablet (25 mg total) by mouth daily. 04/09/18   Salary, Avel Peace, MD  nitroGLYCERIN (NITROSTAT) 0.4 MG SL tablet Place 1 tablet (0.4 mg total) under the tongue every 5 (five) minutes x 3 doses as needed for chest pain. 04/09/18   Salary, Avel Peace, MD    Allergies Iodine  Family History  Problem Relation Age of Onset  . Breast  cancer Neg Hx     Social History Social History   Tobacco Use  . Smoking status: Never Smoker  . Smokeless tobacco: Never Used  Substance Use Topics  . Alcohol use: Never  . Drug use: Never    Review of Systems Constitutional: Positive for chills.  Eyes: No visual changes. ENT: No sore throat. Cardiovascular: Denies chest pain. Respiratory: Denies shortness of breath. Gastrointestinal: No abdominal pain.  Positive for nausea, vomiting and diarrhea.   Genitourinary: Negative for dysuria. Musculoskeletal: Negative for back pain. Skin: Negative  for rash. Neurological: Negative for headaches, focal weakness or numbness.  ____________________________________________   PHYSICAL EXAM:  VITAL SIGNS: ED Triage Vitals  Enc Vitals Group     BP 04/27/19 0837 122/80     Pulse Rate 04/27/19 0837 84     Resp 04/27/19 0837 16     Temp 04/27/19 0837 98.2 F (36.8 C)     Temp Source 04/27/19 0837 Oral     SpO2 04/27/19 0837 96 %     Weight 04/27/19 0832 153 lb (69.4 kg)     Height 04/27/19 0832 5\' 4"  (1.626 m)     Head Circumference --      Peak Flow --      Pain Score 04/27/19 0831 0   Constitutional: Alert and oriented.  Eyes: Conjunctivae are normal.  ENT      Head: Normocephalic and atraumatic.      Nose: No congestion/rhinnorhea.      Mouth/Throat: Mucous membranes are moist.      Neck: No stridor. Hematological/Lymphatic/Immunilogical: No cervical lymphadenopathy. Cardiovascular: Normal rate, regular rhythm.  No murmurs, rubs, or gallops.  Respiratory: Normal respiratory effort without tachypnea nor retractions. Breath sounds are clear and equal bilaterally. No wheezes/rales/rhonchi. Gastrointestinal: Soft and non tender. No rebound. No guarding.  Genitourinary: Deferred Musculoskeletal: Normal range of motion in all extremities. No lower extremity edema. Neurologic:  Normal speech and language. No gross focal neurologic deficits are appreciated.  Skin:  Skin is warm, dry and intact. No rash noted. Psychiatric: Mood and affect are normal. Speech and behavior are normal. Patient exhibits appropriate insight and judgment.  ____________________________________________    LABS (pertinent positives/negatives)  CBC wbc 16.8, hgb 14.2, plt 264 CMP wnl except glu 205 Hemoglobin 13.4 ____________________________________________   EKG  None  ____________________________________________    RADIOLOGY  CT abd/pel Concern for  colitis  ____________________________________________   PROCEDURES  Procedures  ____________________________________________   INITIAL IMPRESSION / ASSESSMENT AND PLAN / ED COURSE  Pertinent labs & imaging results that were available during my care of the patient were reviewed by me and considered in my medical decision making (see chart for details).   Patient presented to the emergency department today with complaints of nausea vomiting and diarrhea.  Symptoms started last night.  Patient also is concerned about dehydration.  Patient's blood work did show a leukocytosis.  CT abdomen pelvis was obtained which was consistent with colitis.  She was given IV fluids and Zofran here in the emergency department.  Repeat hemoglobin did show a slight decrease however I think this is more likely due to dilution secondary to IV bolus.  Patient did not have any further bloody bowel movements while here in the emergency department.  This time I do think it is reasonable for patient be discharged home.  We did discuss return precautions for any further bleeding or shortness of breath or concerns for anemia.  Will give patient prescription for antibiotics to help for  possible bacterial colitis given bleeding.  ___________________________________________   FINAL CLINICAL IMPRESSION(S) / ED DIAGNOSES  Final diagnoses:  Colitis  Dehydration     Note: This dictation was prepared with Dragon dictation. Any transcriptional errors that result from this process are unintentional     Nance Pear, MD 04/27/19 1500

## 2019-04-27 NOTE — ED Notes (Signed)
Pt transported to CT ?

## 2019-04-27 NOTE — ED Triage Notes (Signed)
Pt in via POV, reports being up all night having black, tarry stools.  Denies hx of same.  NAD noted at this time.

## 2019-06-26 DIAGNOSIS — N952 Postmenopausal atrophic vaginitis: Secondary | ICD-10-CM | POA: Diagnosis not present

## 2019-06-26 DIAGNOSIS — N3941 Urge incontinence: Secondary | ICD-10-CM | POA: Diagnosis not present

## 2019-06-26 DIAGNOSIS — N3281 Overactive bladder: Secondary | ICD-10-CM | POA: Diagnosis not present

## 2019-07-01 DIAGNOSIS — Z955 Presence of coronary angioplasty implant and graft: Secondary | ICD-10-CM | POA: Diagnosis not present

## 2019-07-01 DIAGNOSIS — E786 Lipoprotein deficiency: Secondary | ICD-10-CM | POA: Diagnosis not present

## 2019-07-01 DIAGNOSIS — I2510 Atherosclerotic heart disease of native coronary artery without angina pectoris: Secondary | ICD-10-CM | POA: Diagnosis not present

## 2019-07-01 DIAGNOSIS — N3941 Urge incontinence: Secondary | ICD-10-CM | POA: Diagnosis not present

## 2019-07-01 DIAGNOSIS — E039 Hypothyroidism, unspecified: Secondary | ICD-10-CM | POA: Diagnosis not present

## 2019-07-01 DIAGNOSIS — E1165 Type 2 diabetes mellitus with hyperglycemia: Secondary | ICD-10-CM | POA: Diagnosis not present

## 2019-07-01 DIAGNOSIS — R8290 Unspecified abnormal findings in urine: Secondary | ICD-10-CM | POA: Diagnosis not present

## 2019-07-01 DIAGNOSIS — E782 Mixed hyperlipidemia: Secondary | ICD-10-CM | POA: Diagnosis not present

## 2019-07-08 DIAGNOSIS — N390 Urinary tract infection, site not specified: Secondary | ICD-10-CM | POA: Diagnosis not present

## 2019-07-08 DIAGNOSIS — I2510 Atherosclerotic heart disease of native coronary artery without angina pectoris: Secondary | ICD-10-CM | POA: Diagnosis not present

## 2019-07-08 DIAGNOSIS — Z0000 Encounter for general adult medical examination without abnormal findings: Secondary | ICD-10-CM | POA: Diagnosis not present

## 2019-07-08 DIAGNOSIS — E039 Hypothyroidism, unspecified: Secondary | ICD-10-CM | POA: Diagnosis not present

## 2019-07-08 DIAGNOSIS — I10 Essential (primary) hypertension: Secondary | ICD-10-CM | POA: Diagnosis not present

## 2019-07-08 DIAGNOSIS — N3941 Urge incontinence: Secondary | ICD-10-CM | POA: Diagnosis not present

## 2019-07-08 DIAGNOSIS — E1165 Type 2 diabetes mellitus with hyperglycemia: Secondary | ICD-10-CM | POA: Diagnosis not present

## 2019-07-08 DIAGNOSIS — E786 Lipoprotein deficiency: Secondary | ICD-10-CM | POA: Diagnosis not present

## 2019-07-08 DIAGNOSIS — Z955 Presence of coronary angioplasty implant and graft: Secondary | ICD-10-CM | POA: Diagnosis not present

## 2019-07-24 DIAGNOSIS — N952 Postmenopausal atrophic vaginitis: Secondary | ICD-10-CM | POA: Diagnosis not present

## 2019-07-24 DIAGNOSIS — N399 Disorder of urinary system, unspecified: Secondary | ICD-10-CM | POA: Diagnosis not present

## 2019-07-24 DIAGNOSIS — N3281 Overactive bladder: Secondary | ICD-10-CM | POA: Diagnosis not present

## 2019-09-02 DIAGNOSIS — I10 Essential (primary) hypertension: Secondary | ICD-10-CM | POA: Diagnosis not present

## 2019-09-02 DIAGNOSIS — I2510 Atherosclerotic heart disease of native coronary artery without angina pectoris: Secondary | ICD-10-CM | POA: Diagnosis not present

## 2019-09-02 DIAGNOSIS — I25118 Atherosclerotic heart disease of native coronary artery with other forms of angina pectoris: Secondary | ICD-10-CM | POA: Diagnosis not present

## 2019-09-02 DIAGNOSIS — E782 Mixed hyperlipidemia: Secondary | ICD-10-CM | POA: Diagnosis not present

## 2019-09-02 DIAGNOSIS — E1165 Type 2 diabetes mellitus with hyperglycemia: Secondary | ICD-10-CM | POA: Diagnosis not present

## 2019-10-20 DIAGNOSIS — M778 Other enthesopathies, not elsewhere classified: Secondary | ICD-10-CM | POA: Diagnosis not present

## 2019-11-10 DIAGNOSIS — M79671 Pain in right foot: Secondary | ICD-10-CM | POA: Diagnosis not present

## 2019-11-10 DIAGNOSIS — M7731 Calcaneal spur, right foot: Secondary | ICD-10-CM | POA: Diagnosis not present

## 2019-11-10 DIAGNOSIS — M19071 Primary osteoarthritis, right ankle and foot: Secondary | ICD-10-CM | POA: Diagnosis not present

## 2019-12-21 ENCOUNTER — Other Ambulatory Visit: Payer: Self-pay

## 2019-12-21 ENCOUNTER — Emergency Department
Admission: EM | Admit: 2019-12-21 | Discharge: 2019-12-21 | Disposition: A | Discharge status: Home / Self Care | Payer: PPO | Attending: Emergency Medicine | Admitting: Emergency Medicine

## 2019-12-21 ENCOUNTER — Encounter: Payer: Self-pay | Admitting: Intensive Care

## 2019-12-21 DIAGNOSIS — Z79899 Other long term (current) drug therapy: Secondary | ICD-10-CM | POA: Insufficient documentation

## 2019-12-21 DIAGNOSIS — E039 Hypothyroidism, unspecified: Secondary | ICD-10-CM | POA: Diagnosis not present

## 2019-12-21 DIAGNOSIS — R6883 Chills (without fever): Secondary | ICD-10-CM | POA: Diagnosis not present

## 2019-12-21 DIAGNOSIS — R110 Nausea: Secondary | ICD-10-CM | POA: Insufficient documentation

## 2019-12-21 DIAGNOSIS — R197 Diarrhea, unspecified: Secondary | ICD-10-CM | POA: Diagnosis not present

## 2019-12-21 DIAGNOSIS — Z79890 Hormone replacement therapy (postmenopausal): Secondary | ICD-10-CM | POA: Insufficient documentation

## 2019-12-21 DIAGNOSIS — Z20822 Contact with and (suspected) exposure to covid-19: Secondary | ICD-10-CM | POA: Insufficient documentation

## 2019-12-21 DIAGNOSIS — Z7982 Long term (current) use of aspirin: Secondary | ICD-10-CM | POA: Diagnosis not present

## 2019-12-21 DIAGNOSIS — Z955 Presence of coronary angioplasty implant and graft: Secondary | ICD-10-CM | POA: Insufficient documentation

## 2019-12-21 DIAGNOSIS — I10 Essential (primary) hypertension: Secondary | ICD-10-CM | POA: Diagnosis not present

## 2019-12-21 DIAGNOSIS — I2510 Atherosclerotic heart disease of native coronary artery without angina pectoris: Secondary | ICD-10-CM | POA: Insufficient documentation

## 2019-12-21 HISTORY — DX: Essential (primary) hypertension: I10

## 2019-12-21 LAB — URINALYSIS, COMPLETE (UACMP) WITH MICROSCOPIC
Bacteria, UA: NONE SEEN
Bilirubin Urine: NEGATIVE
Glucose, UA: NEGATIVE mg/dL
Hgb urine dipstick: NEGATIVE
Ketones, ur: NEGATIVE mg/dL
Leukocytes,Ua: NEGATIVE
Nitrite: NEGATIVE
Protein, ur: NEGATIVE mg/dL
Specific Gravity, Urine: 1.025 (ref 1.005–1.030)
pH: 5 (ref 5.0–8.0)

## 2019-12-21 LAB — RESPIRATORY PANEL BY RT PCR (FLU A&B, COVID)
Influenza A by PCR: NEGATIVE
Influenza B by PCR: NEGATIVE
SARS Coronavirus 2 by RT PCR: NEGATIVE

## 2019-12-21 LAB — CBC
HCT: 40.7 % (ref 36.0–46.0)
Hemoglobin: 14.4 g/dL (ref 12.0–15.0)
MCH: 32.1 pg (ref 26.0–34.0)
MCHC: 35.4 g/dL (ref 30.0–36.0)
MCV: 90.8 fL (ref 80.0–100.0)
Platelets: 227 10*3/uL (ref 150–400)
RBC: 4.48 MIL/uL (ref 3.87–5.11)
RDW: 12.5 % (ref 11.5–15.5)
WBC: 15.7 10*3/uL — ABNORMAL HIGH (ref 4.0–10.5)
nRBC: 0 % (ref 0.0–0.2)

## 2019-12-21 LAB — LIPASE, BLOOD: Lipase: 25 U/L (ref 11–51)

## 2019-12-21 LAB — COMPREHENSIVE METABOLIC PANEL
ALT: 22 U/L (ref 0–44)
AST: 28 U/L (ref 15–41)
Albumin: 4.2 g/dL (ref 3.5–5.0)
Alkaline Phosphatase: 51 U/L (ref 38–126)
Anion gap: 11 (ref 5–15)
BUN: 29 mg/dL — ABNORMAL HIGH (ref 8–23)
CO2: 25 mmol/L (ref 22–32)
Calcium: 9.4 mg/dL (ref 8.9–10.3)
Chloride: 103 mmol/L (ref 98–111)
Creatinine, Ser: 1.02 mg/dL — ABNORMAL HIGH (ref 0.44–1.00)
GFR calc Af Amer: 58 mL/min — ABNORMAL LOW (ref >60)
GFR calc non Af Amer: 50 mL/min — ABNORMAL LOW (ref >60)
Glucose, Bld: 178 mg/dL — ABNORMAL HIGH (ref 70–99)
Potassium: 4.4 mmol/L (ref 3.5–5.1)
Sodium: 139 mmol/L (ref 135–145)
Total Bilirubin: 1.1 mg/dL (ref 0.3–1.2)
Total Protein: 6.9 g/dL (ref 6.5–8.1)

## 2019-12-21 MED ORDER — ONDANSETRON 4 MG PO TBDP: 4.0000 mg | ORAL_TABLET | Freq: Three times a day (TID) | ORAL | 0 refills | Status: AC | PRN

## 2019-12-21 MED ORDER — ONDANSETRON 4 MG PO TBDP: 4.0000 mg | ORAL_TABLET | Freq: Once | ORAL | Status: DC | PRN

## 2019-12-21 MED ORDER — DICYCLOMINE HCL 10 MG PO CAPS: 10.0000 mg | ORAL_CAPSULE | Freq: Three times a day (TID) | ORAL | 0 refills | Status: DC

## 2019-12-21 NOTE — ED Triage Notes (Signed)
Patient presents with diarrhea, N/V, chills. Denies pain. Reports she is here to be checked for covid

## 2019-12-21 NOTE — ED Provider Notes (Signed)
Emergency Department Provider Note  ____________________________________________  Time seen: Approximately 7:51 PM  I have reviewed the triage vital signs and the nursing notes.   HISTORY  Chief Complaint Nausea, Emesis, Chills, and Diarrhea   Historian Patient     HPI Tracey Robles is a 86 y.o. female presents to the emergency department with nausea, chills and diarrhea that started at 3:00 this morning.  Patient denies current abdominal pain or cramping.  No episodes of diarrhea while waiting in the emergency department.  She states that she worked at [employer] over the weekend and has potential sick contacts.  She denies current chest pain, chest tightness, shortness of breath or nonproductive cough.  No other alleviating measures have been attempted.   Past Medical History:  Diagnosis Date  . Coronary artery disease   . Elevated blood sugar   . Emotional stress   . Hypertension   . Hypothyroidism   . Keratosis   . Stenosing tenosynovitis of finger   . Urge incontinence of urine      Immunizations up to date:  Yes.     Past Medical History:  Diagnosis Date  . Coronary artery disease   . Elevated blood sugar   . Emotional stress   . Hypertension   . Hypothyroidism   . Keratosis   . Stenosing tenosynovitis of finger   . Urge incontinence of urine     Patient Active Problem List   Diagnosis Date Noted  . Non-ST elevation MI (NSTEMI) ([place]) 04/07/2018    Past Surgical History:  Procedure Laterality Date  . ABDOMINAL HYSTERECTOMY    . Bladder Tact Surgery    . CATARACT EXTRACTION    . COLONOSCOPY    . COLONOSCOPY WITH PROPOFOL N/A 02/15/2017   Procedure: COLONOSCOPY WITH PROPOFOL;  Surgeon: Lollie Sails, MD;  Location: [facility] ENDOSCOPY;  Service: Endoscopy;  Laterality: N/A;  . CORONARY STENT INTERVENTION N/A 04/07/2018   Procedure: CORONARY STENT INTERVENTION;  Surgeon: Yolonda Kida, MD;  Location: [place] CV LAB;  Service:  Cardiovascular;  Laterality: N/A;  LAD  . LEFT HEART CATH AND CORONARY ANGIOGRAPHY N/A 04/07/2018   Procedure: LEFT HEART CATH AND CORONARY ANGIOGRAPHY;  Surgeon: Corey Skains, MD;  Location: [place] CV LAB;  Service: Cardiovascular;  Laterality: N/A;  . TUBAL LIGATION      Prior to Admission medications   Medication Sig Start Date End Date Taking? Authorizing Provider  aspirin EC 81 MG EC tablet Take 1 tablet (81 mg total) by mouth daily. 04/09/18   Salary, Avel Peace, MD  atorvastatin (LIPITOR) 40 MG tablet Take 1 tablet (40 mg total) by mouth daily at 6 PM. 04/09/18   Salary, Avel Peace, MD  clobetasol ointment (TEMOVATE) 5.40 % Apply 1 application topically 2 (two) times daily as needed.    [provider]  dicyclomine (BENTYL) 10 MG capsule Take 1 capsule (10 mg total) by mouth 3 (three) times daily before meals for 5 days. 12/21/19 12/26/19  Lannie Fields, PA-C  isosorbide mononitrate (IMDUR) 30 MG 24 hr tablet Take 1 tablet (30 mg total) by mouth daily. 04/09/18   Salary, Avel Peace, MD  levothyroxine (SYNTHROID, LEVOTHROID) 75 MCG tablet Take 75 mcg by mouth daily before breakfast.    [provider]  lisinopril (ZESTRIL) 10 MG tablet Take 10 mg by mouth daily.     Hilbert Odor, PA-C  metoprolol succinate (TOPROL-XL) 25 MG 24 hr tablet Take 1 tablet (25 mg total)  by mouth daily. 04/09/18   Salary, Avel Peace, MD  Multiple Vitamin (MULTIVITAMIN WITH MINERALS) TABS tablet Take 1 tablet by mouth daily.    [provider]  nitroGLYCERIN (NITROSTAT) 0.4 MG SL tablet Place 1 tablet (0.4 mg total) under the tongue every 5 (five) minutes x 3 doses as needed for chest pain. 04/09/18   Salary, Avel Peace, MD  ondansetron (ZOFRAN ODT) 4 MG disintegrating tablet Take 1 tablet (4 mg total) by mouth every 8 (eight) hours as needed for up to 5 days. 12/21/19 12/26/19  Lannie Fields, PA-C  ondansetron (ZOFRAN) 4 MG tablet Take 1 tablet (4 mg total) by mouth every 8 (eight)  hours as needed. 04/27/19   Nance Pear, MD    Allergies Iodine  Family History  Problem Relation Age of Onset  . Breast cancer Neg Hx     Social History Social History   Tobacco Use  . Smoking status: Never Smoker  . Smokeless tobacco: Never Used  Vaping Use  . Vaping Use: Never used  Substance Use Topics  . Alcohol use: Never  . Drug use: Never     Review of Systems  Constitutional: No fever/chills Eyes:  No discharge ENT: No upper respiratory complaints. Respiratory: no cough. No SOB/ use of accessory muscles to breath Gastrointestinal:  Patient has nausea and diarrhea.  Musculoskeletal: Negative for musculoskeletal pain. Skin: Negative for rash, abrasions, lacerations, ecchymosis.    ____________________________________________   PHYSICAL EXAM:  VITAL SIGNS: ED Triage Vitals  Enc Vitals Group     BP 12/21/19 1331 119/62     Pulse Rate 12/21/19 1331 86     Resp 12/21/19 1331 16     Temp 12/21/19 1331 98.7 F (37.1 C)     Temp Source 12/21/19 1331 Oral     SpO2 12/21/19 1331 98 %     Weight 12/21/19 1332 153 lb (69.4 kg)     Height 12/21/19 1332 5\' 4"  (1.626 m)     Head Circumference --      Peak Flow --      Pain Score 12/21/19 1331 0     Pain Loc --      Pain Edu? --      Excl. in [place]? --     Constitutional: Alert and oriented. Patient is lying supine. Eyes: Conjunctivae are normal. PERRL. EOMI. Head: Atraumatic. ENT:      Ears: Tympanic membranes are mildly injected with mild effusion bilaterally.       Nose: No congestion/rhinnorhea.      Mouth/Throat: Mucous membranes are moist. Posterior pharynx is mildly erythematous.  Hematological/Lymphatic/Immunilogical: No cervical lymphadenopathy.  Cardiovascular: Normal rate, regular rhythm. Normal S1 and S2.  Good peripheral circulation. Respiratory: Normal respiratory effort without tachypnea or retractions. Lungs CTAB. Good air entry to the bases with no decreased or absent breath  sounds. Gastrointestinal: Bowel sounds 4 quadrants. Soft and nontender to palpation. No guarding or rigidity. No palpable masses. No distention. No CVA tenderness. Musculoskeletal: Full range of motion to all extremities. No gross deformities appreciated. Neurologic:  Normal speech and language. No gross focal neurologic deficits are appreciated.  Skin:  Skin is warm, dry and intact. No rash noted. Psychiatric: Mood and affect are normal. Speech and behavior are normal. Patient exhibits appropriate insight and judgement.    ____________________________________________   LABS (all labs ordered are listed, but only abnormal results are displayed)  Labs Reviewed  COMPREHENSIVE METABOLIC PANEL - Abnormal; Notable for the following components:  Result Value   Glucose, Bld 178 (*)    BUN 29 (*)    Creatinine, Ser 1.02 (*)    GFR calc non Af Amer 50 (*)    GFR calc Af Amer 58 (*)    All other components within normal limits  CBC - Abnormal; Notable for the following components:   WBC 15.7 (*)    All other components within normal limits  URINALYSIS, COMPLETE (UACMP) WITH MICROSCOPIC - Abnormal; Notable for the following components:   Color, Urine AMBER (*)    APPearance CLOUDY (*)    All other components within normal limits  RESPIRATORY PANEL BY RT PCR (FLU A&B, COVID)  LIPASE, BLOOD   ____________________________________________  EKG   ____________________________________________  RADIOLOGY   No results found.  ____________________________________________    PROCEDURES  Procedure(s) performed:     Procedures     Medications  ondansetron (ZOFRAN-ODT) disintegrating tablet 4 mg (has no administration in time range)     ____________________________________________   INITIAL IMPRESSION / ASSESSMENT AND PLAN / ED COURSE  Pertinent labs & imaging results that were available during my care of the patient were reviewed by me and considered in my medical  decision making (see chart for details).      Assessment and Plan:  Diarrhea Nausea 86 year old female presents to the emergency department with diarrhea and nausea that started at 3:00 AM today.  Vital signs were reassuring at triage.  On physical exam, abdomen was soft and nontender without guarding.  CBC indicated leukocytosis but no other abnormality.  CMP was reassuring.  Urinalysis did not suggest cystitis.  COVID-19 testing is in process at this time.  Patient was discharged with Zofran and Bentyl.  Return precautions were given to return to the emergency department with new or worsening symptoms.  ____________________________________________  FINAL CLINICAL IMPRESSION(S) / ED DIAGNOSES  Final diagnoses:  Diarrhea, unspecified type  Chills      NEW MEDICATIONS STARTED DURING THIS VISIT:  ED Discharge Orders         Ordered    ondansetron (ZOFRAN ODT) 4 MG disintegrating tablet  Every 8 hours PRN        12/21/19 1928    dicyclomine (BENTYL) 10 MG capsule  3 times daily before meals        12/21/19 1928              This chart was dictated using voice recognition software/Dragon. Despite best efforts to proofread, errors can occur which can change the meaning. Any change was purely unintentional.     Lannie Fields, PA-C 12/21/19 1956    Lucrezia Starch, MD 12/21/19 2049

## 2019-12-21 NOTE — ED Triage Notes (Signed)
C/O N//D since 0330.  States she wishes to be tested for COVID.  AAOx3.  Skin warm and dry. NAD. States she had received 2 covid shots.

## 2019-12-21 NOTE — Discharge Instructions (Addendum)
You can take bentyl for abdominal spasms.  You can take Zofran for nausea.

## 2019-12-21 NOTE — ED Notes (Signed)
Full rainbow sent to lab.  

## 2019-12-23 DIAGNOSIS — L578 Other skin changes due to chronic exposure to nonionizing radiation: Secondary | ICD-10-CM | POA: Diagnosis not present

## 2019-12-23 DIAGNOSIS — Z872 Personal history of diseases of the skin and subcutaneous tissue: Secondary | ICD-10-CM | POA: Diagnosis not present

## 2019-12-23 DIAGNOSIS — L570 Actinic keratosis: Secondary | ICD-10-CM | POA: Diagnosis not present

## 2019-12-23 DIAGNOSIS — L821 Other seborrheic keratosis: Secondary | ICD-10-CM | POA: Diagnosis not present

## 2019-12-23 DIAGNOSIS — Z859 Personal history of malignant neoplasm, unspecified: Secondary | ICD-10-CM | POA: Diagnosis not present

## 2019-12-30 DIAGNOSIS — Z0000 Encounter for general adult medical examination without abnormal findings: Secondary | ICD-10-CM | POA: Diagnosis not present

## 2019-12-30 DIAGNOSIS — N390 Urinary tract infection, site not specified: Secondary | ICD-10-CM | POA: Diagnosis not present

## 2019-12-30 DIAGNOSIS — E1165 Type 2 diabetes mellitus with hyperglycemia: Secondary | ICD-10-CM | POA: Diagnosis not present

## 2019-12-30 DIAGNOSIS — I10 Essential (primary) hypertension: Secondary | ICD-10-CM | POA: Diagnosis not present

## 2019-12-30 DIAGNOSIS — N3941 Urge incontinence: Secondary | ICD-10-CM | POA: Diagnosis not present

## 2019-12-30 DIAGNOSIS — E786 Lipoprotein deficiency: Secondary | ICD-10-CM | POA: Diagnosis not present

## 2019-12-30 DIAGNOSIS — I2510 Atherosclerotic heart disease of native coronary artery without angina pectoris: Secondary | ICD-10-CM | POA: Diagnosis not present

## 2019-12-30 DIAGNOSIS — E039 Hypothyroidism, unspecified: Secondary | ICD-10-CM | POA: Diagnosis not present

## 2019-12-30 DIAGNOSIS — Z955 Presence of coronary angioplasty implant and graft: Secondary | ICD-10-CM | POA: Diagnosis not present

## 2020-01-06 DIAGNOSIS — Z955 Presence of coronary angioplasty implant and graft: Secondary | ICD-10-CM | POA: Diagnosis not present

## 2020-01-06 DIAGNOSIS — I25118 Atherosclerotic heart disease of native coronary artery with other forms of angina pectoris: Secondary | ICD-10-CM | POA: Diagnosis not present

## 2020-01-06 DIAGNOSIS — Z23 Encounter for immunization: Secondary | ICD-10-CM | POA: Diagnosis not present

## 2020-01-06 DIAGNOSIS — E1165 Type 2 diabetes mellitus with hyperglycemia: Secondary | ICD-10-CM | POA: Diagnosis not present

## 2020-01-06 DIAGNOSIS — I10 Essential (primary) hypertension: Secondary | ICD-10-CM | POA: Diagnosis not present

## 2020-01-06 DIAGNOSIS — R739 Hyperglycemia, unspecified: Secondary | ICD-10-CM | POA: Diagnosis not present

## 2020-01-06 DIAGNOSIS — I2510 Atherosclerotic heart disease of native coronary artery without angina pectoris: Secondary | ICD-10-CM | POA: Diagnosis not present

## 2020-01-06 DIAGNOSIS — E786 Lipoprotein deficiency: Secondary | ICD-10-CM | POA: Diagnosis not present

## 2020-01-06 DIAGNOSIS — E039 Hypothyroidism, unspecified: Secondary | ICD-10-CM | POA: Diagnosis not present

## 2020-01-19 DIAGNOSIS — Z961 Presence of intraocular lens: Secondary | ICD-10-CM | POA: Diagnosis not present

## 2020-02-24 ENCOUNTER — Other Ambulatory Visit: Payer: Self-pay | Admitting: Internal Medicine

## 2020-02-24 DIAGNOSIS — Z1231 Encounter for screening mammogram for malignant neoplasm of breast: Secondary | ICD-10-CM

## 2020-03-03 DIAGNOSIS — I10 Essential (primary) hypertension: Secondary | ICD-10-CM | POA: Diagnosis not present

## 2020-03-03 DIAGNOSIS — I2510 Atherosclerotic heart disease of native coronary artery without angina pectoris: Secondary | ICD-10-CM | POA: Diagnosis not present

## 2020-03-03 DIAGNOSIS — E782 Mixed hyperlipidemia: Secondary | ICD-10-CM | POA: Diagnosis not present

## 2020-03-03 DIAGNOSIS — I25118 Atherosclerotic heart disease of native coronary artery with other forms of angina pectoris: Secondary | ICD-10-CM | POA: Diagnosis not present

## 2020-03-31 ENCOUNTER — Other Ambulatory Visit: Payer: Self-pay

## 2020-03-31 ENCOUNTER — Ambulatory Visit
Admission: RE | Admit: 2020-03-31 | Discharge: 2020-03-31 | Disposition: A | Discharge status: Home / Self Care | Payer: PPO | Source: Ambulatory Visit | Attending: Internal Medicine | Admitting: Internal Medicine

## 2020-03-31 DIAGNOSIS — Z1231 Encounter for screening mammogram for malignant neoplasm of breast: Secondary | ICD-10-CM | POA: Insufficient documentation

## 2020-06-23 DIAGNOSIS — R739 Hyperglycemia, unspecified: Secondary | ICD-10-CM | POA: Diagnosis not present

## 2020-06-23 DIAGNOSIS — E039 Hypothyroidism, unspecified: Secondary | ICD-10-CM | POA: Diagnosis not present

## 2020-06-23 DIAGNOSIS — Z955 Presence of coronary angioplasty implant and graft: Secondary | ICD-10-CM | POA: Diagnosis not present

## 2020-06-23 DIAGNOSIS — I10 Essential (primary) hypertension: Secondary | ICD-10-CM | POA: Diagnosis not present

## 2020-06-23 DIAGNOSIS — I25118 Atherosclerotic heart disease of native coronary artery with other forms of angina pectoris: Secondary | ICD-10-CM | POA: Diagnosis not present

## 2020-06-23 DIAGNOSIS — E1165 Type 2 diabetes mellitus with hyperglycemia: Secondary | ICD-10-CM | POA: Diagnosis not present

## 2020-06-23 DIAGNOSIS — I2510 Atherosclerotic heart disease of native coronary artery without angina pectoris: Secondary | ICD-10-CM | POA: Diagnosis not present

## 2020-06-23 DIAGNOSIS — L03115 Cellulitis of right lower limb: Secondary | ICD-10-CM | POA: Diagnosis not present

## 2020-06-23 DIAGNOSIS — E786 Lipoprotein deficiency: Secondary | ICD-10-CM | POA: Diagnosis not present

## 2020-07-06 DIAGNOSIS — Z955 Presence of coronary angioplasty implant and graft: Secondary | ICD-10-CM | POA: Diagnosis not present

## 2020-07-06 DIAGNOSIS — I10 Essential (primary) hypertension: Secondary | ICD-10-CM | POA: Diagnosis not present

## 2020-07-06 DIAGNOSIS — I2510 Atherosclerotic heart disease of native coronary artery without angina pectoris: Secondary | ICD-10-CM | POA: Diagnosis not present

## 2020-07-06 DIAGNOSIS — E039 Hypothyroidism, unspecified: Secondary | ICD-10-CM | POA: Diagnosis not present

## 2020-07-06 DIAGNOSIS — L570 Actinic keratosis: Secondary | ICD-10-CM | POA: Diagnosis not present

## 2020-07-06 DIAGNOSIS — Z0000 Encounter for general adult medical examination without abnormal findings: Secondary | ICD-10-CM | POA: Diagnosis not present

## 2020-07-06 DIAGNOSIS — E786 Lipoprotein deficiency: Secondary | ICD-10-CM | POA: Diagnosis not present

## 2020-07-06 DIAGNOSIS — L03115 Cellulitis of right lower limb: Secondary | ICD-10-CM | POA: Diagnosis not present

## 2020-07-06 DIAGNOSIS — E1165 Type 2 diabetes mellitus with hyperglycemia: Secondary | ICD-10-CM | POA: Diagnosis not present

## 2020-07-26 DIAGNOSIS — N952 Postmenopausal atrophic vaginitis: Secondary | ICD-10-CM | POA: Diagnosis not present

## 2020-07-26 DIAGNOSIS — N3941 Urge incontinence: Secondary | ICD-10-CM | POA: Diagnosis not present

## 2020-07-26 DIAGNOSIS — N3281 Overactive bladder: Secondary | ICD-10-CM | POA: Diagnosis not present

## 2020-08-29 DIAGNOSIS — H6123 Impacted cerumen, bilateral: Secondary | ICD-10-CM | POA: Diagnosis not present

## 2020-08-30 DIAGNOSIS — H6121 Impacted cerumen, right ear: Secondary | ICD-10-CM | POA: Diagnosis not present

## 2020-08-30 DIAGNOSIS — H6123 Impacted cerumen, bilateral: Secondary | ICD-10-CM | POA: Diagnosis not present

## 2020-08-30 DIAGNOSIS — H6122 Impacted cerumen, left ear: Secondary | ICD-10-CM | POA: Diagnosis not present

## 2020-09-14 DIAGNOSIS — H6123 Impacted cerumen, bilateral: Secondary | ICD-10-CM | POA: Diagnosis not present

## 2020-09-14 DIAGNOSIS — H903 Sensorineural hearing loss, bilateral: Secondary | ICD-10-CM | POA: Diagnosis not present

## 2020-11-02 DIAGNOSIS — I214 Non-ST elevation (NSTEMI) myocardial infarction: Secondary | ICD-10-CM | POA: Diagnosis not present

## 2020-11-02 DIAGNOSIS — I10 Essential (primary) hypertension: Secondary | ICD-10-CM | POA: Diagnosis not present

## 2020-11-02 DIAGNOSIS — E782 Mixed hyperlipidemia: Secondary | ICD-10-CM | POA: Diagnosis not present

## 2020-11-02 DIAGNOSIS — I2510 Atherosclerotic heart disease of native coronary artery without angina pectoris: Secondary | ICD-10-CM | POA: Diagnosis not present

## 2020-11-02 DIAGNOSIS — E1165 Type 2 diabetes mellitus with hyperglycemia: Secondary | ICD-10-CM | POA: Diagnosis not present

## 2021-01-04 DIAGNOSIS — E039 Hypothyroidism, unspecified: Secondary | ICD-10-CM | POA: Diagnosis not present

## 2021-01-04 DIAGNOSIS — Z955 Presence of coronary angioplasty implant and graft: Secondary | ICD-10-CM | POA: Diagnosis not present

## 2021-01-04 DIAGNOSIS — E786 Lipoprotein deficiency: Secondary | ICD-10-CM | POA: Diagnosis not present

## 2021-01-04 DIAGNOSIS — I10 Essential (primary) hypertension: Secondary | ICD-10-CM | POA: Diagnosis not present

## 2021-01-04 DIAGNOSIS — L03115 Cellulitis of right lower limb: Secondary | ICD-10-CM | POA: Diagnosis not present

## 2021-01-04 DIAGNOSIS — I2510 Atherosclerotic heart disease of native coronary artery without angina pectoris: Secondary | ICD-10-CM | POA: Diagnosis not present

## 2021-01-04 DIAGNOSIS — E1165 Type 2 diabetes mellitus with hyperglycemia: Secondary | ICD-10-CM | POA: Diagnosis not present

## 2021-01-04 DIAGNOSIS — L570 Actinic keratosis: Secondary | ICD-10-CM | POA: Diagnosis not present

## 2021-01-11 DIAGNOSIS — E786 Lipoprotein deficiency: Secondary | ICD-10-CM | POA: Diagnosis not present

## 2021-01-11 DIAGNOSIS — N1831 Chronic kidney disease, stage 3a: Secondary | ICD-10-CM | POA: Diagnosis not present

## 2021-01-11 DIAGNOSIS — N3941 Urge incontinence: Secondary | ICD-10-CM | POA: Diagnosis not present

## 2021-01-11 DIAGNOSIS — H9313 Tinnitus, bilateral: Secondary | ICD-10-CM | POA: Diagnosis not present

## 2021-01-11 DIAGNOSIS — Z0000 Encounter for general adult medical examination without abnormal findings: Secondary | ICD-10-CM | POA: Diagnosis not present

## 2021-01-11 DIAGNOSIS — I2510 Atherosclerotic heart disease of native coronary artery without angina pectoris: Secondary | ICD-10-CM | POA: Diagnosis not present

## 2021-01-11 DIAGNOSIS — E1165 Type 2 diabetes mellitus with hyperglycemia: Secondary | ICD-10-CM | POA: Diagnosis not present

## 2021-01-11 DIAGNOSIS — L570 Actinic keratosis: Secondary | ICD-10-CM | POA: Diagnosis not present

## 2021-01-11 DIAGNOSIS — Z23 Encounter for immunization: Secondary | ICD-10-CM | POA: Diagnosis not present

## 2021-01-18 DIAGNOSIS — Z961 Presence of intraocular lens: Secondary | ICD-10-CM | POA: Diagnosis not present

## 2021-01-20 DIAGNOSIS — X32XXXA Exposure to sunlight, initial encounter: Secondary | ICD-10-CM | POA: Diagnosis not present

## 2021-01-20 DIAGNOSIS — L821 Other seborrheic keratosis: Secondary | ICD-10-CM | POA: Diagnosis not present

## 2021-01-20 DIAGNOSIS — L720 Epidermal cyst: Secondary | ICD-10-CM | POA: Diagnosis not present

## 2021-01-20 DIAGNOSIS — L570 Actinic keratosis: Secondary | ICD-10-CM | POA: Diagnosis not present

## 2021-03-03 DIAGNOSIS — M7732 Calcaneal spur, left foot: Secondary | ICD-10-CM | POA: Diagnosis not present

## 2021-03-03 DIAGNOSIS — M79672 Pain in left foot: Secondary | ICD-10-CM | POA: Diagnosis not present

## 2021-03-03 DIAGNOSIS — M722 Plantar fascial fibromatosis: Secondary | ICD-10-CM | POA: Diagnosis not present

## 2021-05-05 DIAGNOSIS — I10 Essential (primary) hypertension: Secondary | ICD-10-CM | POA: Diagnosis not present

## 2021-05-05 DIAGNOSIS — E782 Mixed hyperlipidemia: Secondary | ICD-10-CM | POA: Diagnosis not present

## 2021-05-05 DIAGNOSIS — I2510 Atherosclerotic heart disease of native coronary artery without angina pectoris: Secondary | ICD-10-CM | POA: Diagnosis not present

## 2021-05-08 ENCOUNTER — Other Ambulatory Visit: Payer: Self-pay | Admitting: Internal Medicine

## 2021-05-08 DIAGNOSIS — Z1231 Encounter for screening mammogram for malignant neoplasm of breast: Secondary | ICD-10-CM

## 2021-06-14 ENCOUNTER — Ambulatory Visit
Admission: RE | Admit: 2021-06-14 | Discharge: 2021-06-14 | Disposition: A | Discharge status: Home / Self Care | Payer: PPO | Source: Ambulatory Visit | Attending: Internal Medicine | Admitting: Internal Medicine

## 2021-06-14 ENCOUNTER — Other Ambulatory Visit: Payer: Self-pay

## 2021-06-14 DIAGNOSIS — Z1231 Encounter for screening mammogram for malignant neoplasm of breast: Secondary | ICD-10-CM | POA: Insufficient documentation

## 2021-06-20 ENCOUNTER — Other Ambulatory Visit: Payer: Self-pay | Admitting: Internal Medicine

## 2021-06-20 DIAGNOSIS — R928 Other abnormal and inconclusive findings on diagnostic imaging of breast: Secondary | ICD-10-CM

## 2021-06-20 DIAGNOSIS — N6489 Other specified disorders of breast: Secondary | ICD-10-CM

## 2021-07-04 DIAGNOSIS — S86112A Strain of other muscle(s) and tendon(s) of posterior muscle group at lower leg level, left leg, initial encounter: Secondary | ICD-10-CM | POA: Diagnosis not present

## 2021-07-06 DIAGNOSIS — H9313 Tinnitus, bilateral: Secondary | ICD-10-CM | POA: Diagnosis not present

## 2021-07-06 DIAGNOSIS — I2510 Atherosclerotic heart disease of native coronary artery without angina pectoris: Secondary | ICD-10-CM | POA: Diagnosis not present

## 2021-07-06 DIAGNOSIS — N3941 Urge incontinence: Secondary | ICD-10-CM | POA: Diagnosis not present

## 2021-07-06 DIAGNOSIS — L570 Actinic keratosis: Secondary | ICD-10-CM | POA: Diagnosis not present

## 2021-07-06 DIAGNOSIS — E1165 Type 2 diabetes mellitus with hyperglycemia: Secondary | ICD-10-CM | POA: Diagnosis not present

## 2021-07-06 DIAGNOSIS — E786 Lipoprotein deficiency: Secondary | ICD-10-CM | POA: Diagnosis not present

## 2021-07-10 ENCOUNTER — Ambulatory Visit
Admission: RE | Admit: 2021-07-10 | Discharge: 2021-07-10 | Disposition: A | Discharge status: Home / Self Care | Payer: PPO | Source: Ambulatory Visit | Attending: Internal Medicine | Admitting: Internal Medicine

## 2021-07-10 DIAGNOSIS — N6489 Other specified disorders of breast: Secondary | ICD-10-CM | POA: Insufficient documentation

## 2021-07-10 DIAGNOSIS — R928 Other abnormal and inconclusive findings on diagnostic imaging of breast: Secondary | ICD-10-CM | POA: Diagnosis not present

## 2021-07-10 DIAGNOSIS — R922 Inconclusive mammogram: Secondary | ICD-10-CM | POA: Diagnosis not present

## 2021-07-13 DIAGNOSIS — H9311 Tinnitus, right ear: Secondary | ICD-10-CM | POA: Diagnosis not present

## 2021-07-13 DIAGNOSIS — E1165 Type 2 diabetes mellitus with hyperglycemia: Secondary | ICD-10-CM | POA: Diagnosis not present

## 2021-07-13 DIAGNOSIS — Z0000 Encounter for general adult medical examination without abnormal findings: Secondary | ICD-10-CM | POA: Diagnosis not present

## 2021-07-13 DIAGNOSIS — N3941 Urge incontinence: Secondary | ICD-10-CM | POA: Diagnosis not present

## 2021-07-13 DIAGNOSIS — I10 Essential (primary) hypertension: Secondary | ICD-10-CM | POA: Diagnosis not present

## 2021-07-13 DIAGNOSIS — I2510 Atherosclerotic heart disease of native coronary artery without angina pectoris: Secondary | ICD-10-CM | POA: Diagnosis not present

## 2021-07-13 DIAGNOSIS — E039 Hypothyroidism, unspecified: Secondary | ICD-10-CM | POA: Diagnosis not present

## 2021-07-13 DIAGNOSIS — Z955 Presence of coronary angioplasty implant and graft: Secondary | ICD-10-CM | POA: Diagnosis not present

## 2021-07-13 DIAGNOSIS — E786 Lipoprotein deficiency: Secondary | ICD-10-CM | POA: Diagnosis not present

## 2021-07-13 DIAGNOSIS — N1831 Chronic kidney disease, stage 3a: Secondary | ICD-10-CM | POA: Diagnosis not present

## 2021-08-07 DIAGNOSIS — N3281 Overactive bladder: Secondary | ICD-10-CM | POA: Diagnosis not present

## 2021-08-07 DIAGNOSIS — N39498 Other specified urinary incontinence: Secondary | ICD-10-CM | POA: Diagnosis not present

## 2021-08-07 DIAGNOSIS — N952 Postmenopausal atrophic vaginitis: Secondary | ICD-10-CM | POA: Diagnosis not present

## 2021-08-07 DIAGNOSIS — Z79899 Other long term (current) drug therapy: Secondary | ICD-10-CM | POA: Diagnosis not present

## 2021-09-01 DIAGNOSIS — M79671 Pain in right foot: Secondary | ICD-10-CM | POA: Diagnosis not present

## 2021-09-01 DIAGNOSIS — M2041 Other hammer toe(s) (acquired), right foot: Secondary | ICD-10-CM | POA: Diagnosis not present

## 2021-09-01 DIAGNOSIS — E119 Type 2 diabetes mellitus without complications: Secondary | ICD-10-CM | POA: Diagnosis not present

## 2021-09-01 DIAGNOSIS — M21622 Bunionette of left foot: Secondary | ICD-10-CM | POA: Diagnosis not present

## 2021-09-01 DIAGNOSIS — M216X2 Other acquired deformities of left foot: Secondary | ICD-10-CM | POA: Diagnosis not present

## 2021-09-01 DIAGNOSIS — M21621 Bunionette of right foot: Secondary | ICD-10-CM | POA: Diagnosis not present

## 2021-09-01 DIAGNOSIS — M2011 Hallux valgus (acquired), right foot: Secondary | ICD-10-CM | POA: Diagnosis not present

## 2021-09-01 DIAGNOSIS — M216X1 Other acquired deformities of right foot: Secondary | ICD-10-CM | POA: Diagnosis not present

## 2021-09-01 DIAGNOSIS — M2042 Other hammer toe(s) (acquired), left foot: Secondary | ICD-10-CM | POA: Diagnosis not present

## 2021-09-20 DIAGNOSIS — E786 Lipoprotein deficiency: Secondary | ICD-10-CM | POA: Diagnosis not present

## 2021-09-20 DIAGNOSIS — I10 Essential (primary) hypertension: Secondary | ICD-10-CM | POA: Diagnosis not present

## 2021-09-20 DIAGNOSIS — E1165 Type 2 diabetes mellitus with hyperglycemia: Secondary | ICD-10-CM | POA: Diagnosis not present

## 2021-09-20 DIAGNOSIS — E039 Hypothyroidism, unspecified: Secondary | ICD-10-CM | POA: Diagnosis not present

## 2021-09-20 DIAGNOSIS — N1831 Chronic kidney disease, stage 3a: Secondary | ICD-10-CM | POA: Diagnosis not present

## 2021-09-20 DIAGNOSIS — I2510 Atherosclerotic heart disease of native coronary artery without angina pectoris: Secondary | ICD-10-CM | POA: Diagnosis not present

## 2021-10-18 DIAGNOSIS — N1831 Chronic kidney disease, stage 3a: Secondary | ICD-10-CM | POA: Diagnosis not present

## 2021-10-18 DIAGNOSIS — I10 Essential (primary) hypertension: Secondary | ICD-10-CM | POA: Diagnosis not present

## 2021-10-18 DIAGNOSIS — E1165 Type 2 diabetes mellitus with hyperglycemia: Secondary | ICD-10-CM | POA: Diagnosis not present

## 2021-10-18 DIAGNOSIS — E039 Hypothyroidism, unspecified: Secondary | ICD-10-CM | POA: Diagnosis not present

## 2021-10-18 DIAGNOSIS — I2510 Atherosclerotic heart disease of native coronary artery without angina pectoris: Secondary | ICD-10-CM | POA: Diagnosis not present

## 2021-11-08 DIAGNOSIS — I2510 Atherosclerotic heart disease of native coronary artery without angina pectoris: Secondary | ICD-10-CM | POA: Diagnosis not present

## 2021-11-08 DIAGNOSIS — E782 Mixed hyperlipidemia: Secondary | ICD-10-CM | POA: Diagnosis not present

## 2021-11-08 DIAGNOSIS — I10 Essential (primary) hypertension: Secondary | ICD-10-CM | POA: Diagnosis not present

## 2022-01-10 DIAGNOSIS — I10 Essential (primary) hypertension: Secondary | ICD-10-CM | POA: Diagnosis not present

## 2022-01-10 DIAGNOSIS — E786 Lipoprotein deficiency: Secondary | ICD-10-CM | POA: Diagnosis not present

## 2022-01-10 DIAGNOSIS — Z955 Presence of coronary angioplasty implant and graft: Secondary | ICD-10-CM | POA: Diagnosis not present

## 2022-01-10 DIAGNOSIS — N3941 Urge incontinence: Secondary | ICD-10-CM | POA: Diagnosis not present

## 2022-01-10 DIAGNOSIS — I2510 Atherosclerotic heart disease of native coronary artery without angina pectoris: Secondary | ICD-10-CM | POA: Diagnosis not present

## 2022-01-10 DIAGNOSIS — R8290 Unspecified abnormal findings in urine: Secondary | ICD-10-CM | POA: Diagnosis not present

## 2022-01-10 DIAGNOSIS — E039 Hypothyroidism, unspecified: Secondary | ICD-10-CM | POA: Diagnosis not present

## 2022-01-10 DIAGNOSIS — R7309 Other abnormal glucose: Secondary | ICD-10-CM | POA: Diagnosis not present

## 2022-01-12 DIAGNOSIS — C44719 Basal cell carcinoma of skin of left lower limb, including hip: Secondary | ICD-10-CM | POA: Diagnosis not present

## 2022-01-12 DIAGNOSIS — L249 Irritant contact dermatitis, unspecified cause: Secondary | ICD-10-CM | POA: Diagnosis not present

## 2022-01-12 DIAGNOSIS — D485 Neoplasm of uncertain behavior of skin: Secondary | ICD-10-CM | POA: Diagnosis not present

## 2022-01-12 DIAGNOSIS — L821 Other seborrheic keratosis: Secondary | ICD-10-CM | POA: Diagnosis not present

## 2022-01-12 DIAGNOSIS — D2271 Melanocytic nevi of right lower limb, including hip: Secondary | ICD-10-CM | POA: Diagnosis not present

## 2022-01-12 DIAGNOSIS — L728 Other follicular cysts of the skin and subcutaneous tissue: Secondary | ICD-10-CM | POA: Diagnosis not present

## 2022-01-12 DIAGNOSIS — D2261 Melanocytic nevi of right upper limb, including shoulder: Secondary | ICD-10-CM | POA: Diagnosis not present

## 2022-01-12 DIAGNOSIS — D2262 Melanocytic nevi of left upper limb, including shoulder: Secondary | ICD-10-CM | POA: Diagnosis not present

## 2022-01-12 DIAGNOSIS — D2272 Melanocytic nevi of left lower limb, including hip: Secondary | ICD-10-CM | POA: Diagnosis not present

## 2022-01-12 DIAGNOSIS — L814 Other melanin hyperpigmentation: Secondary | ICD-10-CM | POA: Diagnosis not present

## 2022-01-12 DIAGNOSIS — D225 Melanocytic nevi of trunk: Secondary | ICD-10-CM | POA: Diagnosis not present

## 2022-01-17 DIAGNOSIS — E782 Mixed hyperlipidemia: Secondary | ICD-10-CM | POA: Diagnosis not present

## 2022-01-17 DIAGNOSIS — N390 Urinary tract infection, site not specified: Secondary | ICD-10-CM | POA: Diagnosis not present

## 2022-01-17 DIAGNOSIS — E786 Lipoprotein deficiency: Secondary | ICD-10-CM | POA: Diagnosis not present

## 2022-01-17 DIAGNOSIS — Z1331 Encounter for screening for depression: Secondary | ICD-10-CM | POA: Diagnosis not present

## 2022-01-17 DIAGNOSIS — H9313 Tinnitus, bilateral: Secondary | ICD-10-CM | POA: Diagnosis not present

## 2022-01-17 DIAGNOSIS — Z955 Presence of coronary angioplasty implant and graft: Secondary | ICD-10-CM | POA: Diagnosis not present

## 2022-01-17 DIAGNOSIS — I10 Essential (primary) hypertension: Secondary | ICD-10-CM | POA: Diagnosis not present

## 2022-01-17 DIAGNOSIS — Z0000 Encounter for general adult medical examination without abnormal findings: Secondary | ICD-10-CM | POA: Diagnosis not present

## 2022-01-17 DIAGNOSIS — Z23 Encounter for immunization: Secondary | ICD-10-CM | POA: Diagnosis not present

## 2022-01-17 DIAGNOSIS — I2510 Atherosclerotic heart disease of native coronary artery without angina pectoris: Secondary | ICD-10-CM | POA: Diagnosis not present

## 2022-01-17 DIAGNOSIS — E039 Hypothyroidism, unspecified: Secondary | ICD-10-CM | POA: Diagnosis not present

## 2022-01-17 DIAGNOSIS — E1165 Type 2 diabetes mellitus with hyperglycemia: Secondary | ICD-10-CM | POA: Diagnosis not present

## 2022-02-14 DIAGNOSIS — Z961 Presence of intraocular lens: Secondary | ICD-10-CM | POA: Diagnosis not present

## 2022-02-16 DIAGNOSIS — C44719 Basal cell carcinoma of skin of left lower limb, including hip: Secondary | ICD-10-CM | POA: Diagnosis not present

## 2022-04-10 DIAGNOSIS — J019 Acute sinusitis, unspecified: Secondary | ICD-10-CM | POA: Diagnosis not present

## 2022-04-10 DIAGNOSIS — Z03818 Encounter for observation for suspected exposure to other biological agents ruled out: Secondary | ICD-10-CM | POA: Diagnosis not present

## 2022-06-05 ENCOUNTER — Other Ambulatory Visit: Payer: Self-pay | Admitting: Internal Medicine

## 2022-06-05 DIAGNOSIS — Z1231 Encounter for screening mammogram for malignant neoplasm of breast: Secondary | ICD-10-CM

## 2022-06-16 IMAGING — MG MM DIGITAL DIAGNOSTIC UNILAT*L* W/ TOMO W/ CAD
6 series · 6 of 18 positions shown · non-contrast
Comparison: Previous exam(s).

CLINICAL DATA: 89-year-old female recalled from screening mammogram
dated 06/14/2021 for a possible left breast mass.

EXAM:
DIGITAL DIAGNOSTIC UNILATERAL LEFT MAMMOGRAM WITH TOMOSYNTHESIS AND
CAD
TECHNIQUE: Left digital diagnostic mammography and breast tomosynthesis was
performed. The images were evaluated with computer-aided detection.

[L ML synth-2D]
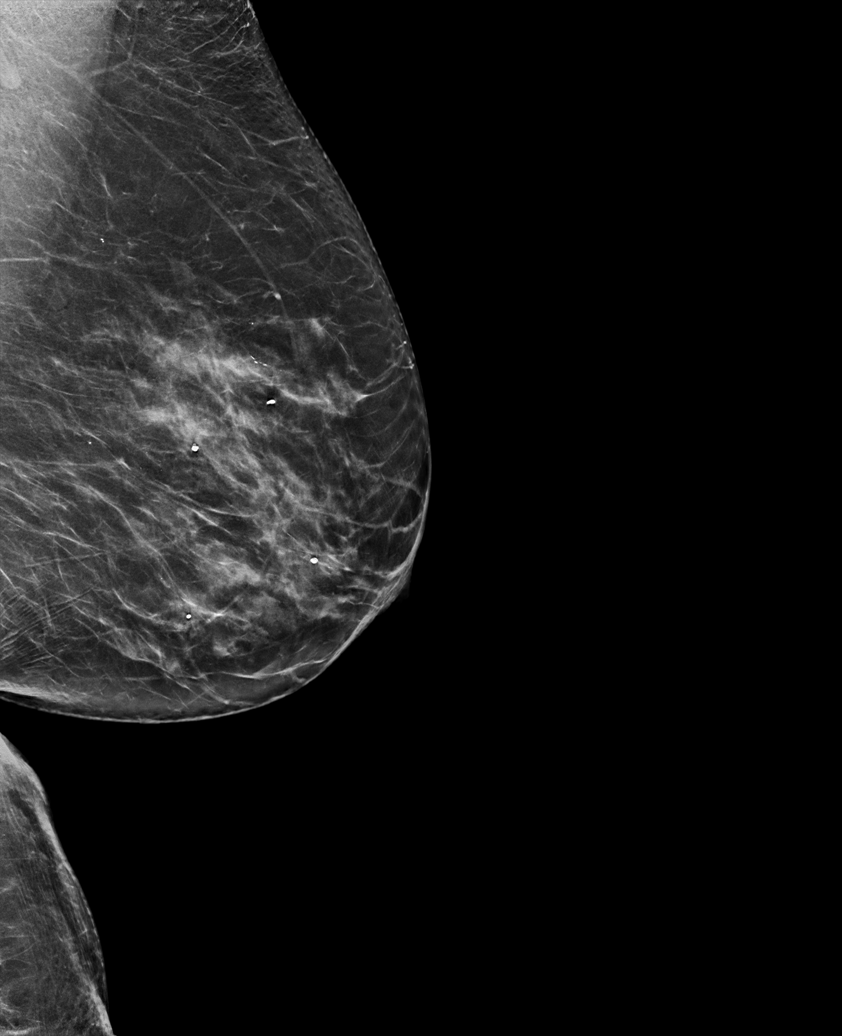

[L CC synth-2D]
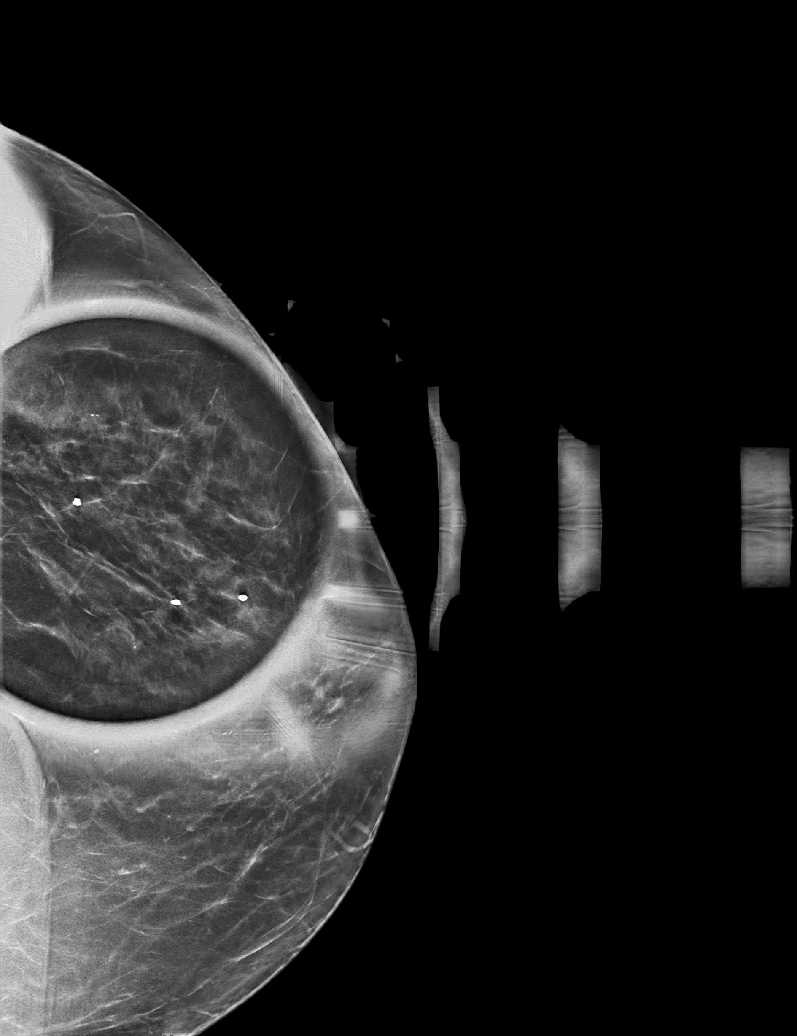

[L MLO synth-2D]
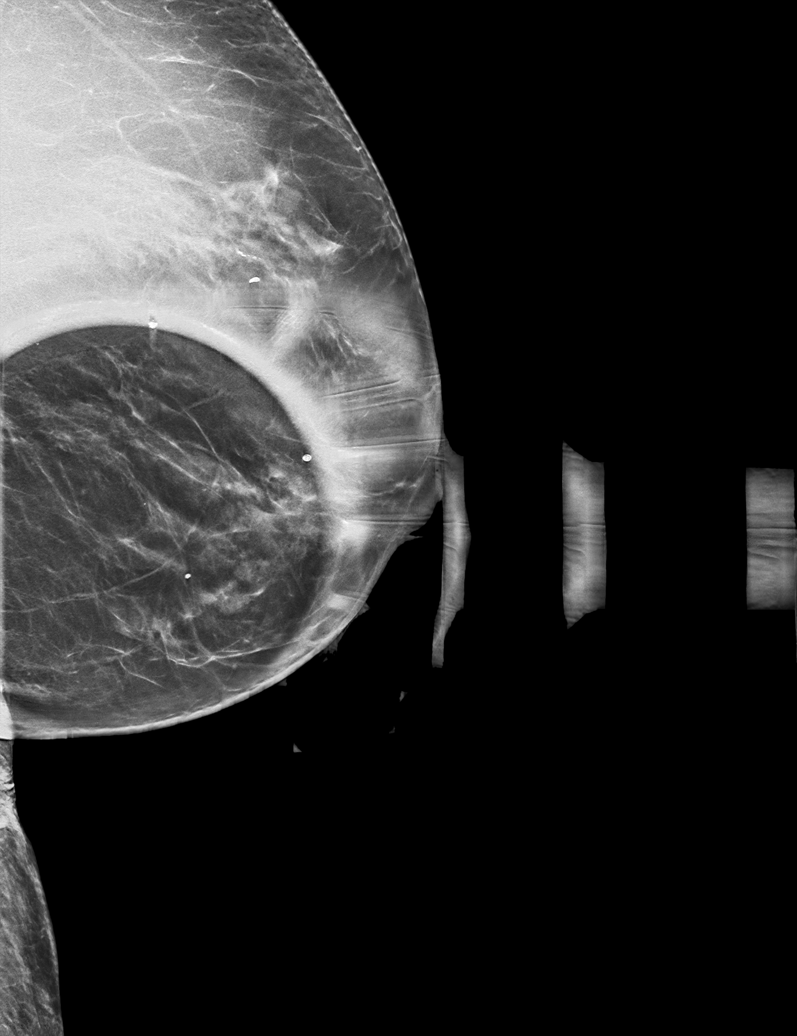

[L CC tomo · tomo slice 29/56.0]
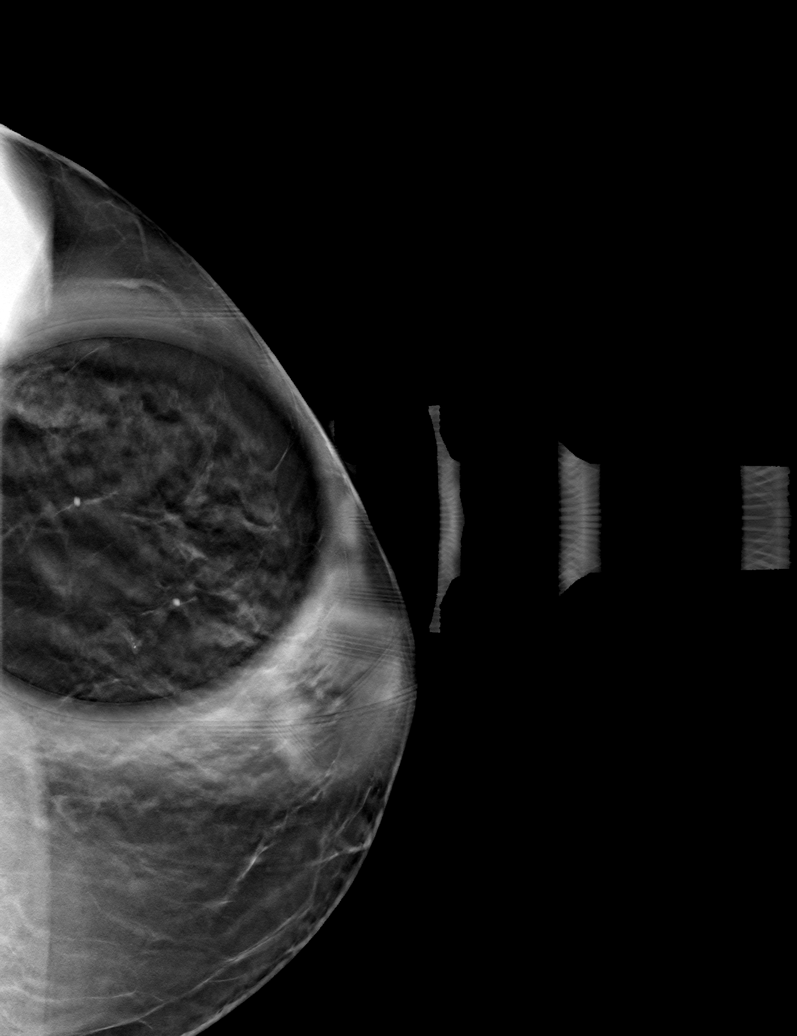

[L MLO tomo · tomo slice 30/59.0]
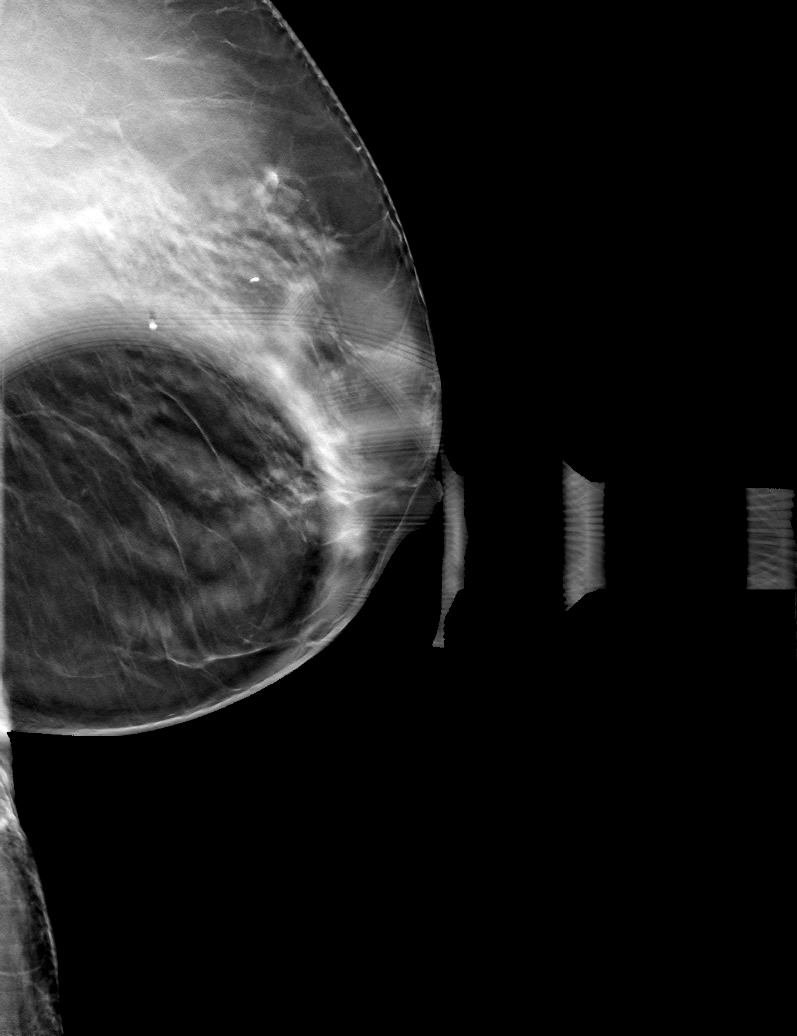

[L ML tomo · tomo slice 37/74.0]
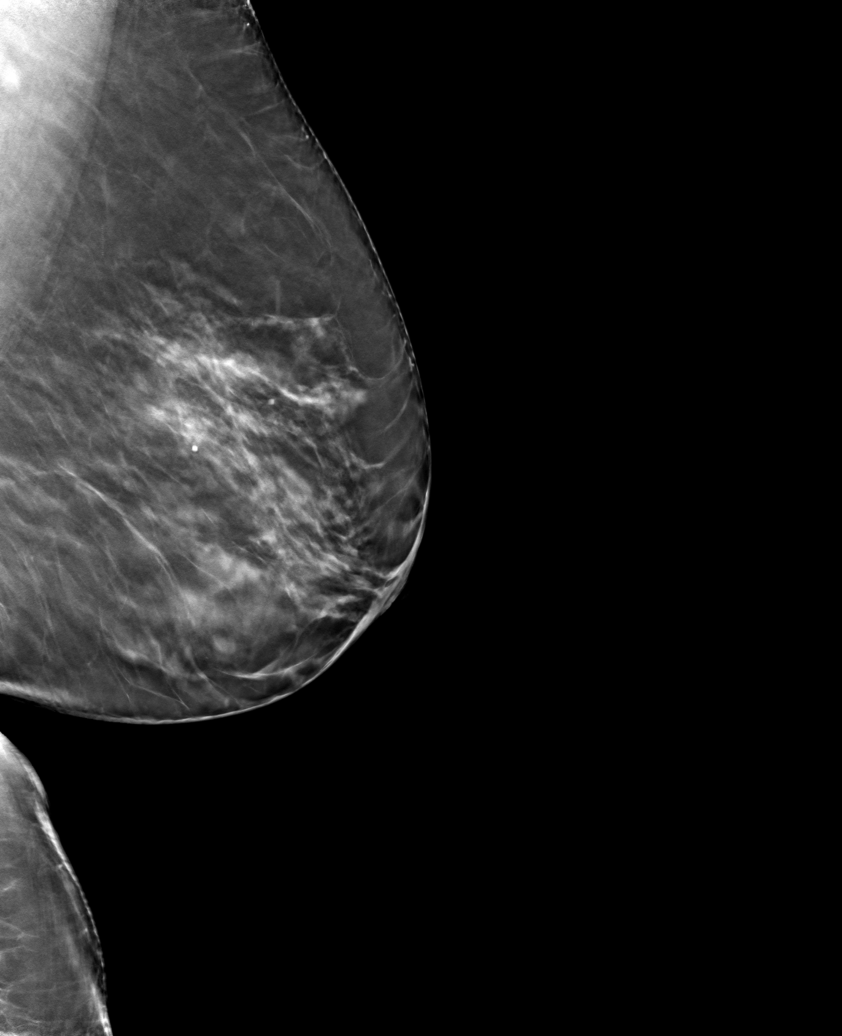

[6 of 18 positions shown; findings below may reference images not displayed]

ACR Breast Density Category c: The breast tissue is heterogeneously
dense, which may obscure small masses.
FINDINGS: Previously described, possible mass in the central left breast at
mid depth resolves into well dispersed fibroglandular tissue on
additional views. No suspicious findings identified.
IMPRESSION: No mammographic evidence of malignancy.

RECOMMENDATION:
Screening mammogram in one year.(Code:PC-A-CYB)

I have discussed the findings and recommendations with the patient.
If applicable, a reminder letter will be sent to the patient
regarding the next appointment.

BI-RADS CATEGORY  1: Negative.

## 2022-07-12 ENCOUNTER — Ambulatory Visit
Admission: RE | Admit: 2022-07-12 | Discharge: 2022-07-12 | Disposition: A | Discharge status: Home / Self Care | Payer: Medicare HMO | Source: Ambulatory Visit | Attending: Internal Medicine | Admitting: Internal Medicine

## 2022-07-12 DIAGNOSIS — Z1231 Encounter for screening mammogram for malignant neoplasm of breast: Secondary | ICD-10-CM | POA: Insufficient documentation

## 2022-08-08 ENCOUNTER — Ambulatory Visit: Payer: Medicare HMO | Admitting: Urology

## 2022-08-08 VITALS — BP 124/75 | HR 75 | Ht 64.0 in | Wt 150.0 lb

## 2022-08-08 DIAGNOSIS — N952 Postmenopausal atrophic vaginitis: Secondary | ICD-10-CM | POA: Diagnosis not present

## 2022-08-08 DIAGNOSIS — Z87440 Personal history of urinary (tract) infections: Secondary | ICD-10-CM

## 2022-08-08 DIAGNOSIS — N3281 Overactive bladder: Secondary | ICD-10-CM

## 2022-08-08 DIAGNOSIS — N390 Urinary tract infection, site not specified: Secondary | ICD-10-CM

## 2022-08-08 DIAGNOSIS — R32 Unspecified urinary incontinence: Secondary | ICD-10-CM

## 2022-08-08 DIAGNOSIS — N3941 Urge incontinence: Secondary | ICD-10-CM | POA: Diagnosis not present

## 2022-08-08 LAB — URINALYSIS, COMPLETE
Bilirubin, UA: NEGATIVE
Glucose, UA: NEGATIVE
Ketones, UA: NEGATIVE
Nitrite, UA: NEGATIVE
Protein,UA: NEGATIVE
RBC, UA: NEGATIVE
Specific Gravity, UA: 1.02 (ref 1.005–1.030)
Urobilinogen, Ur: 0.2 mg/dL (ref 0.2–1.0)
pH, UA: 5.5 (ref 5.0–7.5)

## 2022-08-08 LAB — MICROSCOPIC EXAMINATION: Epithelial Cells (non renal): >10 /hpf — AB (ref 0–10)

## 2022-08-08 LAB — BLADDER SCAN AMB NON-IMAGING: Scan Result: 14

## 2022-08-08 MED ORDER — MIRABEGRON ER 50 MG PO TB24: 50.0000 mg | ORAL_TABLET | Freq: Every day | ORAL | 3 refills | Status: DC

## 2022-08-08 NOTE — Progress Notes (Signed)
Marcelle Overlie Plume,acting as a scribe for Vanna Scotland, MD.,have documented all relevant documentation on the behalf of Vanna Scotland, MD,as directed by  Vanna Scotland, MD while in the presence of Vanna Scotland, MD.  08/08/2022 9:17 AM   Tracey Robles 03/03/1932 [id number]  Referring provider: Barbette Reichmann, MD [address] [facility] [place],  [place] [address]  Chief Complaint  Patient presents with   New Patient (Initial Visit)    HPI:  89 year old female with a history of atrophic vaginitis, recurrent UTIs, and OAB with urgency incontinence who presents today to establish care.   She was formerly a patient of Dr. Evelene Croon. She has been managed with topical estrogen cream. Since starting this medication, presumably, she has had no infections. She also uses myrbetriq 25 mg for her urgency frequency symptoms. She has previously tried gemtesa, but this is not cost effective for her.   Today, she reports leakage about one to two times per day and continues to wear absorbent leak pads. She has not had any recent UTIs.    Results for orders placed or performed in visit on 08/08/22  BLADDER SCAN AMB NON-IMAGING  Result Value Ref Range   Scan Result 14 ml      PMH: Past Medical History:  Diagnosis Date   Coronary artery disease    Elevated blood sugar    Emotional stress    Hypertension    Hypothyroidism    Keratosis    Stenosing tenosynovitis of finger    Urge incontinence of urine     Surgical History: Past Surgical History:  Procedure Laterality Date   ABDOMINAL HYSTERECTOMY     Bladder Tact Surgery     CATARACT EXTRACTION     COLONOSCOPY     COLONOSCOPY WITH PROPOFOL N/A 02/15/2017   Procedure: COLONOSCOPY WITH PROPOFOL;  Surgeon: Christena Deem, MD;  Location: [facility] ENDOSCOPY;  Service: Endoscopy;  Laterality: N/A;   CORONARY STENT INTERVENTION N/A 04/07/2018   Procedure: CORONARY STENT INTERVENTION;  Surgeon: Alwyn Pea, MD;   Location: [facility] INVASIVE CV LAB;  Service: Cardiovascular;  Laterality: N/A;  LAD   LEFT HEART CATH AND CORONARY ANGIOGRAPHY N/A 04/07/2018   Procedure: LEFT HEART CATH AND CORONARY ANGIOGRAPHY;  Surgeon: Lamar Blinks, MD;  Location: [facility] INVASIVE CV LAB;  Service: Cardiovascular;  Laterality: N/A;   TUBAL LIGATION      Home Medications:  Allergies as of 08/08/2022       Reactions   Iodine Itching        Medication List        Accurate as of Aug 08, 2022  9:17 AM. If you have any questions, ask your nurse or doctor.          STOP taking these medications    dicyclomine 10 MG capsule Commonly known as: Bentyl   isosorbide mononitrate 30 MG 24 hr tablet Commonly known as: IMDUR   metoprolol succinate 25 MG 24 hr tablet Commonly known as: TOPROL-XL   ondansetron 4 MG tablet Commonly known as: Zofran       TAKE these medications    aspirin EC 81 MG tablet Take 1 tablet (81 mg total) by mouth daily.   atorvastatin 40 MG tablet Commonly known as: LIPITOR Take 1 tablet (40 mg total) by mouth daily at 6 PM.   clobetasol ointment 0.05 % Commonly known as: TEMOVATE Apply 1 application topically 2 (two) times daily as needed.   estradiol 0.1 MG/GM vaginal cream  Commonly known as: ESTRACE Place 1 Applicatorful vaginally at bedtime.   levothyroxine 75 MCG tablet Commonly known as: SYNTHROID Take 75 mcg by mouth daily before breakfast.   lisinopril 10 MG tablet Commonly known as: ZESTRIL Take 10 mg by mouth daily.   mirabegron ER 50 MG Tb24 tablet Commonly known as: MYRBETRIQ Take 1 tablet (50 mg total) by mouth daily. What changed:  medication strength how much to take   multivitamin with minerals Tabs tablet Take 1 tablet by mouth daily.   nitroGLYCERIN 0.4 MG SL tablet Commonly known as: NITROSTAT Place 1 tablet (0.4 mg total) under the tongue every 5 (five) minutes x 3 doses as needed for chest pain.        Allergies:  Allergies  Allergen  Reactions   Iodine Itching    Family History: Family History  Problem Relation Age of Onset   Breast cancer Neg Hx     Social History:  reports that she has never smoked. She has never used smokeless tobacco. She reports that she does not drink alcohol and does not use drugs.   Physical Exam: BP 124/75   Pulse 75   Ht 5\' 4"  (1.626 m)   Wt 150 lb (68 kg)   BMI 25.75 kg/m   Constitutional:  Alert and oriented, No acute distress. HEENT: [place] AT, moist mucus membranes.  Trachea midline, no masses. Neurologic: Grossly intact, no focal deficits, moving all 4 extremities. Psychiatric: Normal mood and affect.   Assessment & Plan:    1. OAB urge incontinence - Continues to have moderate symptoms - Continue myrbetriq, increase dose to 50 mg given ongoing symptoms to maximize therapeutic benefit - She is emptying her bladder well. UA is negative  2. History of Recurrent UTI/ atrophic vaginitis - Continue using topical estrogen cream and encouraged use on a regular basis - She does not need a refill at this time  Return in about 1 year (around 08/08/2023) for PA visit with Sam or Carollee Herter.  I have reviewed the above documentation for accuracy and completeness, and I agree with the above.   Vanna Scotland, MD   [facility] [address] [place], [place] [address] [phone]

## 2022-08-21 DIAGNOSIS — I10 Essential (primary) hypertension: Secondary | ICD-10-CM | POA: Diagnosis not present

## 2022-08-21 DIAGNOSIS — R42 Dizziness and giddiness: Secondary | ICD-10-CM | POA: Diagnosis not present

## 2022-08-21 DIAGNOSIS — I2510 Atherosclerotic heart disease of native coronary artery without angina pectoris: Secondary | ICD-10-CM | POA: Diagnosis not present

## 2022-08-21 DIAGNOSIS — E782 Mixed hyperlipidemia: Secondary | ICD-10-CM | POA: Diagnosis not present

## 2022-08-21 DIAGNOSIS — Z955 Presence of coronary angioplasty implant and graft: Secondary | ICD-10-CM | POA: Diagnosis not present

## 2022-08-21 DIAGNOSIS — I38 Endocarditis, valve unspecified: Secondary | ICD-10-CM | POA: Diagnosis not present

## 2022-08-21 DIAGNOSIS — E1165 Type 2 diabetes mellitus with hyperglycemia: Secondary | ICD-10-CM | POA: Diagnosis not present

## 2022-08-29 DIAGNOSIS — I2510 Atherosclerotic heart disease of native coronary artery without angina pectoris: Secondary | ICD-10-CM | POA: Diagnosis not present

## 2022-08-29 DIAGNOSIS — I38 Endocarditis, valve unspecified: Secondary | ICD-10-CM | POA: Diagnosis not present

## 2022-09-19 DIAGNOSIS — D225 Melanocytic nevi of trunk: Secondary | ICD-10-CM | POA: Diagnosis not present

## 2022-09-19 DIAGNOSIS — D2271 Melanocytic nevi of right lower limb, including hip: Secondary | ICD-10-CM | POA: Diagnosis not present

## 2022-09-19 DIAGNOSIS — D485 Neoplasm of uncertain behavior of skin: Secondary | ICD-10-CM | POA: Diagnosis not present

## 2022-09-19 DIAGNOSIS — D2262 Melanocytic nevi of left upper limb, including shoulder: Secondary | ICD-10-CM | POA: Diagnosis not present

## 2022-09-19 DIAGNOSIS — C44712 Basal cell carcinoma of skin of right lower limb, including hip: Secondary | ICD-10-CM | POA: Diagnosis not present

## 2022-09-19 DIAGNOSIS — D2261 Melanocytic nevi of right upper limb, including shoulder: Secondary | ICD-10-CM | POA: Diagnosis not present

## 2022-09-19 DIAGNOSIS — Z85828 Personal history of other malignant neoplasm of skin: Secondary | ICD-10-CM | POA: Diagnosis not present

## 2022-10-05 DIAGNOSIS — D2371 Other benign neoplasm of skin of right lower limb, including hip: Secondary | ICD-10-CM | POA: Diagnosis not present

## 2022-10-05 DIAGNOSIS — C44712 Basal cell carcinoma of skin of right lower limb, including hip: Secondary | ICD-10-CM | POA: Diagnosis not present

## 2022-10-10 DIAGNOSIS — Z955 Presence of coronary angioplasty implant and graft: Secondary | ICD-10-CM | POA: Diagnosis not present

## 2022-10-10 DIAGNOSIS — I214 Non-ST elevation (NSTEMI) myocardial infarction: Secondary | ICD-10-CM | POA: Diagnosis not present

## 2022-10-10 DIAGNOSIS — R42 Dizziness and giddiness: Secondary | ICD-10-CM | POA: Diagnosis not present

## 2022-10-10 DIAGNOSIS — I10 Essential (primary) hypertension: Secondary | ICD-10-CM | POA: Diagnosis not present

## 2022-10-10 DIAGNOSIS — E782 Mixed hyperlipidemia: Secondary | ICD-10-CM | POA: Diagnosis not present

## 2022-10-10 DIAGNOSIS — I2510 Atherosclerotic heart disease of native coronary artery without angina pectoris: Secondary | ICD-10-CM | POA: Diagnosis not present

## 2022-10-10 DIAGNOSIS — I38 Endocarditis, valve unspecified: Secondary | ICD-10-CM | POA: Diagnosis not present

## 2022-10-29 DIAGNOSIS — C44712 Basal cell carcinoma of skin of right lower limb, including hip: Secondary | ICD-10-CM | POA: Diagnosis not present

## 2023-01-23 DIAGNOSIS — E1165 Type 2 diabetes mellitus with hyperglycemia: Secondary | ICD-10-CM | POA: Diagnosis not present

## 2023-01-23 DIAGNOSIS — Z955 Presence of coronary angioplasty implant and graft: Secondary | ICD-10-CM | POA: Diagnosis not present

## 2023-01-23 DIAGNOSIS — E782 Mixed hyperlipidemia: Secondary | ICD-10-CM | POA: Diagnosis not present

## 2023-01-23 DIAGNOSIS — I10 Essential (primary) hypertension: Secondary | ICD-10-CM | POA: Diagnosis not present

## 2023-01-23 DIAGNOSIS — I2510 Atherosclerotic heart disease of native coronary artery without angina pectoris: Secondary | ICD-10-CM | POA: Diagnosis not present

## 2023-01-23 DIAGNOSIS — N390 Urinary tract infection, site not specified: Secondary | ICD-10-CM | POA: Diagnosis not present

## 2023-01-23 DIAGNOSIS — E786 Lipoprotein deficiency: Secondary | ICD-10-CM | POA: Diagnosis not present

## 2023-01-23 DIAGNOSIS — E039 Hypothyroidism, unspecified: Secondary | ICD-10-CM | POA: Diagnosis not present

## 2023-01-30 DIAGNOSIS — Z955 Presence of coronary angioplasty implant and graft: Secondary | ICD-10-CM | POA: Diagnosis not present

## 2023-01-30 DIAGNOSIS — Z23 Encounter for immunization: Secondary | ICD-10-CM | POA: Diagnosis not present

## 2023-01-30 DIAGNOSIS — I2510 Atherosclerotic heart disease of native coronary artery without angina pectoris: Secondary | ICD-10-CM | POA: Diagnosis not present

## 2023-01-30 DIAGNOSIS — Z0000 Encounter for general adult medical examination without abnormal findings: Secondary | ICD-10-CM | POA: Diagnosis not present

## 2023-01-30 DIAGNOSIS — H9313 Tinnitus, bilateral: Secondary | ICD-10-CM | POA: Diagnosis not present

## 2023-01-30 DIAGNOSIS — N3941 Urge incontinence: Secondary | ICD-10-CM | POA: Diagnosis not present

## 2023-01-30 DIAGNOSIS — Z1331 Encounter for screening for depression: Secondary | ICD-10-CM | POA: Diagnosis not present

## 2023-01-30 DIAGNOSIS — E039 Hypothyroidism, unspecified: Secondary | ICD-10-CM | POA: Diagnosis not present

## 2023-01-30 DIAGNOSIS — E1122 Type 2 diabetes mellitus with diabetic chronic kidney disease: Secondary | ICD-10-CM | POA: Diagnosis not present

## 2023-02-05 DIAGNOSIS — M7918 Myalgia, other site: Secondary | ICD-10-CM | POA: Diagnosis not present

## 2023-02-05 DIAGNOSIS — M25552 Pain in left hip: Secondary | ICD-10-CM | POA: Diagnosis not present

## 2023-02-05 DIAGNOSIS — M85852 Other specified disorders of bone density and structure, left thigh: Secondary | ICD-10-CM | POA: Diagnosis not present

## 2023-02-19 DIAGNOSIS — S32512A Fracture of superior rim of left pubis, initial encounter for closed fracture: Secondary | ICD-10-CM | POA: Diagnosis not present

## 2023-02-20 DIAGNOSIS — Z961 Presence of intraocular lens: Secondary | ICD-10-CM | POA: Diagnosis not present

## 2023-02-21 ENCOUNTER — Other Ambulatory Visit: Payer: Self-pay | Admitting: Orthopedic Surgery

## 2023-02-21 DIAGNOSIS — S32512A Fracture of superior rim of left pubis, initial encounter for closed fracture: Secondary | ICD-10-CM

## 2023-02-25 DIAGNOSIS — M8588 Other specified disorders of bone density and structure, other site: Secondary | ICD-10-CM | POA: Diagnosis not present

## 2023-03-04 ENCOUNTER — Encounter: Payer: Self-pay | Admitting: Orthopedic Surgery

## 2023-03-07 ENCOUNTER — Ambulatory Visit
Admission: RE | Admit: 2023-03-07 | Discharge: 2023-03-07 | Disposition: A | Discharge status: Home / Self Care | Payer: Medicare HMO | Source: Ambulatory Visit | Attending: Orthopedic Surgery | Admitting: Orthopedic Surgery

## 2023-03-07 DIAGNOSIS — S32512A Fracture of superior rim of left pubis, initial encounter for closed fracture: Secondary | ICD-10-CM

## 2023-03-07 DIAGNOSIS — R102 Pelvic and perineal pain: Secondary | ICD-10-CM | POA: Diagnosis not present

## 2023-05-17 DIAGNOSIS — D2262 Melanocytic nevi of left upper limb, including shoulder: Secondary | ICD-10-CM | POA: Diagnosis not present

## 2023-05-17 DIAGNOSIS — D485 Neoplasm of uncertain behavior of skin: Secondary | ICD-10-CM | POA: Diagnosis not present

## 2023-05-17 DIAGNOSIS — D225 Melanocytic nevi of trunk: Secondary | ICD-10-CM | POA: Diagnosis not present

## 2023-05-17 DIAGNOSIS — Z85828 Personal history of other malignant neoplasm of skin: Secondary | ICD-10-CM | POA: Diagnosis not present

## 2023-05-17 DIAGNOSIS — D2261 Melanocytic nevi of right upper limb, including shoulder: Secondary | ICD-10-CM | POA: Diagnosis not present

## 2023-05-17 DIAGNOSIS — D2272 Melanocytic nevi of left lower limb, including hip: Secondary | ICD-10-CM | POA: Diagnosis not present

## 2023-05-17 DIAGNOSIS — L538 Other specified erythematous conditions: Secondary | ICD-10-CM | POA: Diagnosis not present

## 2023-07-04 ENCOUNTER — Other Ambulatory Visit: Payer: Self-pay | Admitting: Internal Medicine

## 2023-07-04 DIAGNOSIS — Z1231 Encounter for screening mammogram for malignant neoplasm of breast: Secondary | ICD-10-CM

## 2023-07-16 ENCOUNTER — Ambulatory Visit
Admission: RE | Admit: 2023-07-16 | Discharge: 2023-07-16 | Disposition: A | Discharge status: Home / Self Care | Source: Ambulatory Visit | Attending: Internal Medicine | Admitting: Internal Medicine

## 2023-07-16 DIAGNOSIS — Z1231 Encounter for screening mammogram for malignant neoplasm of breast: Secondary | ICD-10-CM | POA: Diagnosis not present

## 2023-08-08 ENCOUNTER — Ambulatory Visit: Payer: Self-pay | Admitting: Urology

## 2023-08-08 ENCOUNTER — Encounter: Payer: Self-pay | Admitting: Urology

## 2023-08-08 VITALS — BP 145/78 | HR 67 | Ht 64.0 in | Wt 152.0 lb

## 2023-08-08 DIAGNOSIS — N3281 Overactive bladder: Secondary | ICD-10-CM

## 2023-08-08 DIAGNOSIS — N390 Urinary tract infection, site not specified: Secondary | ICD-10-CM | POA: Diagnosis not present

## 2023-08-08 DIAGNOSIS — N952 Postmenopausal atrophic vaginitis: Secondary | ICD-10-CM | POA: Diagnosis not present

## 2023-08-08 DIAGNOSIS — R32 Unspecified urinary incontinence: Secondary | ICD-10-CM

## 2023-08-08 LAB — BLADDER SCAN AMB NON-IMAGING

## 2023-08-08 MED ORDER — MIRABEGRON ER 50 MG PO TB24: 50.0000 mg | ORAL_TABLET | Freq: Every day | ORAL | 3 refills | Status: AC

## 2023-08-08 MED ORDER — ESTRADIOL 0.1 MG/GM VA CREA
TOPICAL_CREAM | VAGINAL | 12 refills | Status: AC
Start: 2023-08-08 — End: ?

## 2023-08-08 NOTE — Progress Notes (Signed)
 08/08/2023 9:04 AM   Tracey Robles Apr 06, 1931 [id number]  Referring provider: Antonio Baumgarten, MD [address] [facility] [place],  [place] [address]  Urological history: 1. OAB - pelvic MRI (02/2023) NED  - Myrbetriq  50 mg daily - Vaginal estrogen cream  2. rUTI's  Chief Complaint  Patient presents with   Over Active Bladder   HPI: Tracey Robles is a 89 y.o. woman who presents today for yearly visit.    Previous records reviewed.   She is having 1-7 daytime voids, 1-2 episodes of nocturia with strong urge to urinate.  She has both stress and urge incontinence.  She leaks 1-2 times a day.  She wears panty liners daily.  She does not limit fluid intake.  She does engage in toilet mapping.  Patient denies any modifying or aggravating factors.  Patient denies any recent UTI's, gross hematuria, dysuria or suprapubic/flank pain.  Patient denies any fevers, chills, nausea or vomiting.    PVR 6 mL   PMH: Past Medical History:  Diagnosis Date   Coronary artery disease    Elevated blood sugar    Emotional stress    Hypertension    Hypothyroidism    Keratosis    Stenosing tenosynovitis of finger    Urge incontinence of urine     Surgical History: Past Surgical History:  Procedure Laterality Date   ABDOMINAL HYSTERECTOMY     Bladder Tact Surgery     CATARACT EXTRACTION     COLONOSCOPY     COLONOSCOPY WITH PROPOFOL  N/A 02/15/2017   Procedure: COLONOSCOPY WITH PROPOFOL ;  Surgeon: Deveron Fly, MD;  Location: [facility] ENDOSCOPY;  Service: Endoscopy;  Laterality: N/A;   CORONARY STENT INTERVENTION N/A 04/07/2018   Procedure: CORONARY STENT INTERVENTION;  Surgeon: Antonette Batters, MD;  Location: [facility] INVASIVE CV LAB;  Service: Cardiovascular;  Laterality: N/A;  LAD   LEFT HEART CATH AND CORONARY ANGIOGRAPHY N/A 04/07/2018   Procedure: LEFT HEART CATH AND CORONARY ANGIOGRAPHY;  Surgeon: Michelle Aid, MD;  Location: [facility] INVASIVE CV LAB;   Service: Cardiovascular;  Laterality: N/A;   TUBAL LIGATION      Home Medications:  Allergies as of 08/08/2023       Reactions   Iodine Itching        Medication List        Accurate as of Aug 08, 2023  9:04 AM. If you have any questions, ask your nurse or doctor.          aspirin  EC 81 MG tablet Take 1 tablet (81 mg total) by mouth daily.   atorvastatin  40 MG tablet Commonly known as: LIPITOR Take 1 tablet (40 mg total) by mouth daily at 6 PM.   clobetasol ointment 0.05 % Commonly known as: TEMOVATE Apply 1 application topically 2 (two) times daily as needed.   estradiol 0.1 MG/GM vaginal cream Commonly known as: ESTRACE Place 1 Applicatorful vaginally at bedtime. What changed: Another medication with the same name was added. Make sure you understand how and when to take each. Changed by: Matilde Son   estradiol 0.1 MG/GM vaginal cream Commonly known as: ESTRACE VAGINAL Apply 0.5mg  (pea-sized amount)  just inside the vaginal introitus with a finger-tip on Monday, Wednesday and Friday nights. What changed: You were already taking a medication with the same name, and this prescription was added. Make sure you understand how and when to take each. Changed by: Matilde Son   levothyroxine  75 MCG tablet Commonly known  as: SYNTHROID  Take 75 mcg by mouth daily before breakfast.   lisinopril 10 MG tablet Commonly known as: ZESTRIL Take 10 mg by mouth daily.   metFORMIN 500 MG tablet Commonly known as: GLUCOPHAGE Take 1 tablet by mouth daily with breakfast.   mirabegron  ER 50 MG Tb24 tablet Commonly known as: MYRBETRIQ  Take 1 tablet (50 mg total) by mouth daily.   multivitamin with minerals Tabs tablet Take 1 tablet by mouth daily.   nitroGLYCERIN  0.4 MG SL tablet Commonly known as: NITROSTAT  Place 1 tablet (0.4 mg total) under the tongue every 5 (five) minutes x 3 doses as needed for chest pain.        Allergies:  Allergies  Allergen Reactions    Iodine Itching    Family History: Family History  Problem Relation Age of Onset   Breast cancer Neg Hx     Social History:  reports that she has never smoked. She has never used smokeless tobacco. She reports that she does not drink alcohol  and does not use drugs.  ROS: Pertinent ROS in HPI  Physical Exam: BP (!) 145/78   Pulse 67   Ht 5\' 4"  (1.626 m)   Wt 152 lb (68.9 kg)   BMI 26.09 kg/m   Constitutional:  Well nourished. Alert and oriented, No acute distress. HEENT: [place] AT, moist mucus membranes.  Trachea midline Cardiovascular: No clubbing, cyanosis, or edema. Respiratory: Normal respiratory effort, no increased work of breathing. Neurologic: Grossly intact, no focal deficits, moving all 4 extremities. Psychiatric: Normal mood and affect.  Laboratory Data: ntains abnormal data CBC w/auto Differential (5 Part) Order: [id number] Component Ref Range & Units 6 mo ago  WBC (White Blood Cell Count) 4.1 - 10.2 10^3/uL 7  RBC (Red Blood Cell Count) 4.04 - 5.48 10^6/uL 4.27  Hemoglobin 12.0 - 15.0 gm/dL 19.1  Hematocrit 47.8 - 47.0 % 40.3  MCV (Mean Corpuscular Volume) 80.0 - 100.0 fl 94.4  MCH (Mean Corpuscular Hemoglobin) 27.0 - 31.2 pg 31.4 High   MCHC (Mean Corpuscular Hemoglobin Concentration) 32.0 - 36.0 gm/dL 29.5  Platelet Count 621 - 450 10^3/uL 233  RDW-CV (Red Cell Distribution Width) 11.6 - 14.8 % 12.4  MPV (Mean Platelet Volume) 9.4 - 12.4 fl 11.5  Neutrophils 1.50 - 7.80 10^3/uL 3.72  Lymphocytes 1.00 - 3.60 10^3/uL 2.45  Monocytes 0.00 - 1.50 10^3/uL 0.51  Eosinophils 0.00 - 0.55 10^3/uL 0.25  Basophils 0.00 - 0.09 10^3/uL 0.06  Neutrophil % 32.0 - 70.0 % 53.1  Lymphocyte % 10.0 - 50.0 % 35  Monocyte % 4.0 - 13.0 % 7.3  Eosinophil % 1.0 - 5.0 % 3.6  Basophil% 0.0 - 2.0 % 0.9  Immature Granulocyte % <=0.7 % 0.1  Immature Granulocyte Count <=0.06 10^3/L 0.01  Resulting Agency [facility]   Specimen Collected:  01/23/23 07:52   Performed by: [facility] Last Resulted: 01/23/23 11:35  Received From: [facility]  Result Received: 02/21/23 09:56    ontains abnormal data Comprehensive Metabolic Panel (CMP) Order: [id number] Component Ref Range & Units 6 mo ago  Glucose 70 - 110 mg/dL 962 High   Sodium 952 - 145 mmol/L 143  Potassium 3.6 - 5.1 mmol/L 4.5  Chloride 97 - 109 mmol/L 106  Carbon Dioxide (CO2) 22.0 - 32.0 mmol/L 29.8  Urea Nitrogen (BUN) 7 - 25 mg/dL 21  Creatinine 0.6 - 1.1 mg/dL 1  Glomerular Filtration Rate (eGFR) >60 mL/min/1.73sq m 54 Low   Comment:  CKD-EPI (2021) does not include patient's race in the calculation of eGFR.  Monitoring changes of plasma creatinine and eGFR over time is useful for monitoring kidney function.  Interpretive Ranges for eGFR (CKD-EPI 2021):  eGFR:       >60 mL/min/1.73 sq. m - Normal eGFR:       30-59 mL/min/1.73 sq. m - Moderately Decreased eGFR:       15-29 mL/min/1.73 sq. m  - Severely Decreased eGFR:       < 15 mL/min/1.73 sq. m  - Kidney Failure   Note: These eGFR calculations do not apply in acute situations when eGFR is changing rapidly or patients on dialysis.  Calcium  8.7 - 10.3 mg/dL 9.3  AST 8 - 39 U/L 21  ALT 5 - 38 U/L 18  Alk Phos (alkaline Phosphatase) 34 - 104 U/L 65  Albumin 3.5 - 4.8 g/dL 4.2  Bilirubin, Total 0.3 - 1.2 mg/dL 0.6  Protein, Total 6.1 - 7.9 g/dL 6.5  A/G Ratio 1.0 - 5.0 gm/dL 1.8  Resulting Agency [facility]   Specimen Collected: 01/23/23 07:52   Performed by: [facility] Last Resulted: 01/23/23 11:15  Received From: [facility]  Result Received: 02/21/23 09:56   ontains abnormal data Hemoglobin A1C Order: [id number] Component Ref Range & Units 6 mo ago  Hemoglobin A1C 4.2 - 5.6 % 8.5 High   Average Blood Glucose (Calc) mg/dL 409  Resulting Agency [facility]  Narrative Performed by [facility] Normal Range:    4.2 - 5.6% Increased Risk:  5.7 - 6.4% Diabetes:        >= 6.5% Glycemic Control for adults with diabetes:  <7%    Specimen Collected: 01/23/23 07:52   Performed by: [facility] Last Resulted: 01/23/23 08:39  Received From: [facility]  Result Received: 02/21/23 09:56   Urinalysis Urinalysis w/Microscopic Order: [id number] Component Ref Range & Units 6 mo ago  Color Colorless, Straw, Light Yellow, Yellow, Dark Yellow Light Yellow  Clarity Clear Clear  Specific Gravity 1.005 - 1.030 1.011  pH, Urine 5.0 - 8.0 5  Protein, Urinalysis Negative mg/dL Negative  Glucose, Urinalysis Negative mg/dL Negative  Ketones, Urinalysis Negative mg/dL Negative  Blood, Urinalysis Negative Negative  Nitrite, Urinalysis Negative Negative  Leukocyte Esterase, Urinalysis Negative 1+ Abnormal   Bilirubin, Urinalysis Negative Negative  Urobilinogen, Urinalysis 0.2 - 1.0 mg/dL 0.2  WBC, UA <=5 /hpf 4  Red Blood Cells, Urinalysis <=3 /hpf 1  Bacteria, Urinalysis 0 - 5 /hpf 0-5  Squamous Epithelial Cells, Urinalysis /hpf 2  Resulting Agency [facility]   Specimen Collected: 01/23/23 07:52   Performed by: [facility] Last Resulted: 01/23/23 08:46  Received From: [facility]  Result Received: 02/21/23 09:56  I have reviewed the labs.   Pertinent Imaging:  08/08/23 08:43  Scan Result 6ml    Assessment & Plan:    1. Overactive bladder (Primary) - Bladder Scan (Post Void Residual) in office, demonstrates adequate bladder emptying - Still having issues with incontinence, we discussed PTNS therapy, she deferred stating that her symptoms were not that severe - Continue Myrbetriq  50 mg daily, refill sent to pharmacy  2. rUTI's/vaginal atrophy - No documented or reported UTIs in the last year -She did ask why we did not take a urine sample today, I explained to her that we  do not "  screen" for infection unless the patient complains of UTI symptoms - Continue vaginal estrogen cream 3 nights weekly, refill sent to pharmacy  Return in about 1 year (around 08/07/2024) for OAB questionnaire.  These notes generated with voice recognition software. I apologize for typographical errors.  Briant Camper  [facility] [address]  [address] [place], [place] [address] [phone]

## 2023-10-09 DIAGNOSIS — Z955 Presence of coronary angioplasty implant and graft: Secondary | ICD-10-CM | POA: Diagnosis not present

## 2023-10-09 DIAGNOSIS — I214 Non-ST elevation (NSTEMI) myocardial infarction: Secondary | ICD-10-CM | POA: Diagnosis not present

## 2023-10-09 DIAGNOSIS — E782 Mixed hyperlipidemia: Secondary | ICD-10-CM | POA: Diagnosis not present

## 2023-10-09 DIAGNOSIS — I2510 Atherosclerotic heart disease of native coronary artery without angina pectoris: Secondary | ICD-10-CM | POA: Diagnosis not present

## 2023-10-09 DIAGNOSIS — I10 Essential (primary) hypertension: Secondary | ICD-10-CM | POA: Diagnosis not present

## 2023-10-09 DIAGNOSIS — E1165 Type 2 diabetes mellitus with hyperglycemia: Secondary | ICD-10-CM | POA: Diagnosis not present

## 2023-10-23 DIAGNOSIS — I214 Non-ST elevation (NSTEMI) myocardial infarction: Secondary | ICD-10-CM | POA: Diagnosis not present

## 2023-10-23 DIAGNOSIS — Z955 Presence of coronary angioplasty implant and graft: Secondary | ICD-10-CM | POA: Diagnosis not present

## 2023-10-23 DIAGNOSIS — I2510 Atherosclerotic heart disease of native coronary artery without angina pectoris: Secondary | ICD-10-CM | POA: Diagnosis not present

## 2024-01-26 DIAGNOSIS — S51811A Laceration without foreign body of right forearm, initial encounter: Secondary | ICD-10-CM | POA: Diagnosis not present

## 2024-01-26 DIAGNOSIS — Z23 Encounter for immunization: Secondary | ICD-10-CM | POA: Diagnosis not present

## 2024-01-26 DIAGNOSIS — S66901A Unspecified injury of unspecified muscle, fascia and tendon at wrist and hand level, right hand, initial encounter: Secondary | ICD-10-CM | POA: Diagnosis not present

## 2024-01-27 DIAGNOSIS — S51811A Laceration without foreign body of right forearm, initial encounter: Secondary | ICD-10-CM | POA: Diagnosis not present

## 2024-01-30 DIAGNOSIS — I10 Essential (primary) hypertension: Secondary | ICD-10-CM | POA: Diagnosis not present

## 2024-01-30 DIAGNOSIS — E786 Lipoprotein deficiency: Secondary | ICD-10-CM | POA: Diagnosis not present

## 2024-01-30 DIAGNOSIS — H9313 Tinnitus, bilateral: Secondary | ICD-10-CM | POA: Diagnosis not present

## 2024-01-30 DIAGNOSIS — I2510 Atherosclerotic heart disease of native coronary artery without angina pectoris: Secondary | ICD-10-CM | POA: Diagnosis not present

## 2024-01-30 DIAGNOSIS — E782 Mixed hyperlipidemia: Secondary | ICD-10-CM | POA: Diagnosis not present

## 2024-01-30 DIAGNOSIS — Z955 Presence of coronary angioplasty implant and graft: Secondary | ICD-10-CM | POA: Diagnosis not present

## 2024-01-30 DIAGNOSIS — E039 Hypothyroidism, unspecified: Secondary | ICD-10-CM | POA: Diagnosis not present

## 2024-01-30 DIAGNOSIS — Z0000 Encounter for general adult medical examination without abnormal findings: Secondary | ICD-10-CM | POA: Diagnosis not present

## 2024-01-30 DIAGNOSIS — N390 Urinary tract infection, site not specified: Secondary | ICD-10-CM | POA: Diagnosis not present

## 2024-01-30 DIAGNOSIS — E1165 Type 2 diabetes mellitus with hyperglycemia: Secondary | ICD-10-CM | POA: Diagnosis not present

## 2024-01-30 DIAGNOSIS — N3941 Urge incontinence: Secondary | ICD-10-CM | POA: Diagnosis not present

## 2024-02-05 DIAGNOSIS — E1122 Type 2 diabetes mellitus with diabetic chronic kidney disease: Secondary | ICD-10-CM | POA: Diagnosis not present

## 2024-02-05 DIAGNOSIS — I10 Essential (primary) hypertension: Secondary | ICD-10-CM | POA: Diagnosis not present

## 2024-02-05 DIAGNOSIS — I2510 Atherosclerotic heart disease of native coronary artery without angina pectoris: Secondary | ICD-10-CM | POA: Diagnosis not present

## 2024-02-05 DIAGNOSIS — H9313 Tinnitus, bilateral: Secondary | ICD-10-CM | POA: Diagnosis not present

## 2024-02-05 DIAGNOSIS — E039 Hypothyroidism, unspecified: Secondary | ICD-10-CM | POA: Diagnosis not present

## 2024-02-05 DIAGNOSIS — N3941 Urge incontinence: Secondary | ICD-10-CM | POA: Diagnosis not present

## 2024-02-05 DIAGNOSIS — Z0000 Encounter for general adult medical examination without abnormal findings: Secondary | ICD-10-CM | POA: Diagnosis not present

## 2024-02-05 DIAGNOSIS — S51811D Laceration without foreign body of right forearm, subsequent encounter: Secondary | ICD-10-CM | POA: Diagnosis not present

## 2024-02-05 DIAGNOSIS — Z1331 Encounter for screening for depression: Secondary | ICD-10-CM | POA: Diagnosis not present

## 2024-02-05 DIAGNOSIS — E1165 Type 2 diabetes mellitus with hyperglycemia: Secondary | ICD-10-CM | POA: Diagnosis not present

## 2024-02-05 DIAGNOSIS — N1831 Chronic kidney disease, stage 3a: Secondary | ICD-10-CM | POA: Diagnosis not present

## 2024-02-05 DIAGNOSIS — Z23 Encounter for immunization: Secondary | ICD-10-CM | POA: Diagnosis not present

## 2024-02-10 DIAGNOSIS — S51811D Laceration without foreign body of right forearm, subsequent encounter: Secondary | ICD-10-CM | POA: Diagnosis not present

## 2024-08-07 ENCOUNTER — Ambulatory Visit: Admitting: Urology
